# Patient Record
Sex: Male | Born: 1990 | Race: White | Hispanic: No | Marital: Single | Smoking: Current every day smoker
Health system: Southern US, Community
[De-identification: ages and names within clinical notes are randomized; demographics above are authoritative.]

## PROBLEM LIST (undated history)

## (undated) ENCOUNTER — Emergency Department: Admission: EM | Payer: Self-pay | Source: Home / Self Care

## (undated) DIAGNOSIS — T7840XA Allergy, unspecified, initial encounter: Secondary | ICD-10-CM

## (undated) DIAGNOSIS — B9689 Other specified bacterial agents as the cause of diseases classified elsewhere: Secondary | ICD-10-CM

## (undated) DIAGNOSIS — I5189 Other ill-defined heart diseases: Secondary | ICD-10-CM

## (undated) DIAGNOSIS — F1910 Other psychoactive substance abuse, uncomplicated: Secondary | ICD-10-CM

## (undated) DIAGNOSIS — E7800 Pure hypercholesterolemia, unspecified: Secondary | ICD-10-CM

## (undated) HISTORY — DX: Other psychoactive substance abuse, uncomplicated: F19.10

## (undated) HISTORY — DX: Other ill-defined heart diseases: I51.89

## (undated) HISTORY — DX: Allergy, unspecified, initial encounter: T78.40XA

## (undated) HISTORY — DX: Other specified bacterial agents as the cause of diseases classified elsewhere: B96.89

## (undated) HISTORY — DX: Pure hypercholesterolemia, unspecified: E78.00

---
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
## 2013-07-08 ENCOUNTER — Emergency Department: Payer: Self-pay | Admitting: Emergency Medicine

## 2013-07-08 LAB — CBC WITH DIFFERENTIAL/PLATELET
BASOS ABS: 0.1 10*3/uL (ref 0.0–0.1)
Basophil %: 0.6 %
EOS ABS: 0 10*3/uL (ref 0.0–0.7)
Eosinophil %: 0.3 %
HCT: 50.6 % (ref 40.0–52.0)
HGB: 17.1 g/dL (ref 13.0–18.0)
LYMPHS ABS: 3 10*3/uL (ref 1.0–3.6)
Lymphocyte %: 27.4 %
MCH: 30 pg (ref 26.0–34.0)
MCHC: 33.7 g/dL (ref 32.0–36.0)
MCV: 89 fL (ref 80–100)
MONOS PCT: 8.7 %
Monocyte #: 1 x10 3/mm (ref 0.2–1.0)
NEUTROS ABS: 6.9 10*3/uL — AB (ref 1.4–6.5)
Neutrophil %: 63 %
Platelet: 255 10*3/uL (ref 150–440)
RBC: 5.68 10*6/uL (ref 4.40–5.90)
RDW: 14.1 % (ref 11.5–14.5)
WBC: 11 10*3/uL — ABNORMAL HIGH (ref 3.8–10.6)

## 2013-07-08 LAB — COMPREHENSIVE METABOLIC PANEL
ALT: 19 U/L (ref 12–78)
AST: 31 U/L (ref 15–37)
Albumin: 5.5 g/dL — ABNORMAL HIGH (ref 3.4–5.0)
Alkaline Phosphatase: 91 U/L
Anion Gap: 11 (ref 7–16)
BILIRUBIN TOTAL: 0.7 mg/dL (ref 0.2–1.0)
BUN: 18 mg/dL (ref 7–18)
CHLORIDE: 105 mmol/L (ref 98–107)
Calcium, Total: 10.1 mg/dL (ref 8.5–10.1)
Co2: 23 mmol/L (ref 21–32)
Creatinine: 1.15 mg/dL (ref 0.60–1.30)
EGFR (African American): >60
EGFR (Non-African Amer.): >60
GLUCOSE: 110 mg/dL — AB (ref 65–99)
OSMOLALITY: 280 (ref 275–301)
Potassium: 4.1 mmol/L (ref 3.5–5.1)
Sodium: 139 mmol/L (ref 136–145)
Total Protein: 9.1 g/dL — ABNORMAL HIGH (ref 6.4–8.2)

## 2013-07-08 LAB — URINALYSIS, COMPLETE
BILIRUBIN, UR: NEGATIVE
Blood: NEGATIVE
GLUCOSE, UR: NEGATIVE mg/dL (ref 0–75)
KETONE: NEGATIVE
LEUKOCYTE ESTERASE: NEGATIVE
Nitrite: NEGATIVE
Ph: 6 (ref 4.5–8.0)
Protein: 100
Specific Gravity: 1.03 (ref 1.003–1.030)

## 2013-07-14 ENCOUNTER — Emergency Department: Payer: Self-pay | Admitting: Emergency Medicine

## 2013-07-14 LAB — URINALYSIS, COMPLETE
Bacteria: NONE SEEN
Bilirubin,UR: NEGATIVE
Glucose,UR: NEGATIVE mg/dL (ref 0–75)
Ketone: NEGATIVE
Nitrite: NEGATIVE
PH: 6 (ref 4.5–8.0)
RBC,UR: 123 /HPF (ref 0–5)
Specific Gravity: 1.019 (ref 1.003–1.030)
Squamous Epithelial: 1
WBC UR: 6 /HPF (ref 0–5)

## 2013-07-14 LAB — COMPREHENSIVE METABOLIC PANEL
ALK PHOS: 72 U/L
ALT: 19 U/L (ref 12–78)
ANION GAP: 6 — AB (ref 7–16)
Albumin: 4.5 g/dL (ref 3.4–5.0)
BILIRUBIN TOTAL: 0.3 mg/dL (ref 0.2–1.0)
BUN: 13 mg/dL (ref 7–18)
CALCIUM: 9.7 mg/dL (ref 8.5–10.1)
CO2: 25 mmol/L (ref 21–32)
Chloride: 108 mmol/L — ABNORMAL HIGH (ref 98–107)
Creatinine: 1.11 mg/dL (ref 0.60–1.30)
EGFR (African American): >60
GLUCOSE: 118 mg/dL — AB (ref 65–99)
OSMOLALITY: 279 (ref 275–301)
Potassium: 4 mmol/L (ref 3.5–5.1)
SGOT(AST): 30 U/L (ref 15–37)
SODIUM: 139 mmol/L (ref 136–145)
TOTAL PROTEIN: 7.7 g/dL (ref 6.4–8.2)

## 2013-07-14 LAB — CBC
HCT: 44 % (ref 40.0–52.0)
HGB: 14.9 g/dL (ref 13.0–18.0)
MCH: 30.4 pg (ref 26.0–34.0)
MCHC: 33.8 g/dL (ref 32.0–36.0)
MCV: 90 fL (ref 80–100)
Platelet: 196 10*3/uL (ref 150–440)
RBC: 4.9 10*6/uL (ref 4.40–5.90)
RDW: 14 % (ref 11.5–14.5)
WBC: 5.2 10*3/uL (ref 3.8–10.6)

## 2015-02-04 ENCOUNTER — Encounter: Payer: Self-pay | Admitting: Medical Oncology

## 2015-02-04 ENCOUNTER — Emergency Department
Admission: EM | Admit: 2015-02-04 | Discharge: 2015-02-04 | Disposition: A | Discharge status: Home / Self Care | Payer: Self-pay | Attending: Emergency Medicine | Admitting: Emergency Medicine

## 2015-02-04 DIAGNOSIS — Z880 Allergy status to penicillin: Secondary | ICD-10-CM | POA: Insufficient documentation

## 2015-02-04 DIAGNOSIS — K0889 Other specified disorders of teeth and supporting structures: Secondary | ICD-10-CM | POA: Insufficient documentation

## 2015-02-04 DIAGNOSIS — K029 Dental caries, unspecified: Secondary | ICD-10-CM | POA: Insufficient documentation

## 2015-02-04 DIAGNOSIS — F17200 Nicotine dependence, unspecified, uncomplicated: Secondary | ICD-10-CM | POA: Insufficient documentation

## 2015-02-04 DIAGNOSIS — K047 Periapical abscess without sinus: Secondary | ICD-10-CM | POA: Insufficient documentation

## 2015-02-04 MED ORDER — DOXYCYCLINE HYCLATE 100 MG PO TABS: 100.0000 mg | ORAL_TABLET | Freq: Two times a day (BID) | ORAL | Status: DC

## 2015-02-04 MED ORDER — MAGIC MOUTHWASH W/LIDOCAINE
5.0000 mL | Freq: Four times a day (QID) | ORAL | Status: DC
Start: 2015-02-04 — End: 2015-10-11

## 2015-02-04 MED ORDER — LIDOCAINE-EPINEPHRINE 2 %-1:100000 IJ SOLN
1.7000 mL | Freq: Once | INTRAMUSCULAR | Status: DC
  Filled 2015-02-04: qty 1.7

## 2015-02-04 NOTE — Discharge Instructions (Signed)
Dental Abscess °A dental abscess is a collection of pus in or around a tooth. °CAUSES °This condition is caused by a bacterial infection around the root of the tooth that involves the inner part of the tooth (pulp). It may result from: °· Severe tooth decay. °· Trauma to the tooth that allows bacteria to enter into the pulp, such as a broken or chipped tooth. °· Severe gum disease around a tooth. °SYMPTOMS °Symptoms of this condition include: °· Severe pain in and around the infected tooth. °· Swelling and redness around the infected tooth, in the mouth, or in the face. °· Tenderness. °· Pus drainage. °· Bad breath. °· Bitter taste in the mouth. °· Difficulty swallowing. °· Difficulty opening the mouth. °· Nausea. °· Vomiting. °· Chills. °· Swollen neck glands. °· Fever. °DIAGNOSIS °This condition is diagnosed with examination of the infected tooth. During the exam, your dentist may tap on the infected tooth. Your dentist will also ask about your medical and dental history and may order X-rays. °TREATMENT °This condition is treated by eliminating the infection. This may be done with: °· Antibiotic medicine. °· A root canal. This may be performed to save the tooth. °· Pulling (extracting) the tooth. This may also involve draining the abscess. This is done if the tooth cannot be saved. °HOME CARE INSTRUCTIONS °· Take medicines only as directed by your dentist. °· If you were prescribed antibiotic medicine, finish all of it even if you start to feel better. °· Rinse your mouth (gargle) often with salt water to relieve pain or swelling. °· Do not drive or operate heavy machinery while taking pain medicine. °· Do not apply heat to the outside of your mouth. °· Keep all follow-up visits as directed by your dentist. This is important. °SEEK MEDICAL CARE IF: °· Your pain is worse and is not helped by medicine. °SEEK IMMEDIATE MEDICAL CARE IF: °· You have a fever or chills. °· Your symptoms suddenly get worse. °· You have a  very bad headache. °· You have problems breathing or swallowing. °· You have trouble opening your mouth. °· You have swelling in your neck or around your eye. °  °This information is not intended to replace advice given to you by your health care provider. Make sure you discuss any questions you have with your health care provider. °  °Document Released: 01/09/2005 Document Revised: 05/26/2014 Document Reviewed: 01/06/2014 °Elsevier Interactive Patient Education ©2016 [employer] ° °Dental Care and Dentist Visits °Dental care supports good overall health. Regular dental visits can also help you avoid dental pain, bleeding, infection, and other more serious health problems in the future. It is important to keep the mouth healthy because diseases in the teeth, gums, and other oral tissues can spread to other areas of the body. Some problems, such as diabetes, heart disease, and pre-term labor have been associated with poor oral health.  °See your dentist every 6 months. If you experience emergency problems such as a toothache or broken tooth, go to the dentist right away. If you see your dentist regularly, you may catch problems early. It is easier to be treated for problems in the early stages.  °WHAT TO EXPECT AT A DENTIST VISIT  °Your dentist will look for many common oral health problems and recommend proper treatment. At your regular dental visit, you can expect: °· Gentle cleaning of the teeth and gums. This includes scraping and polishing. This helps to remove the sticky substance around the teeth and gums (  plaque). Plaque forms in the mouth shortly after eating. Over time, plaque hardens on the teeth as tartar. If tartar is not removed regularly, it can cause problems. Cleaning also helps remove stains. °· Periodic X-rays. These pictures of the teeth and supporting bone will help your dentist assess the health of your teeth. °· Periodic fluoride treatments. Fluoride is a natural mineral shown to help  strengthen teeth. Fluoride treatment involves applying a fluoride gel or varnish to the teeth. It is most commonly done in children. °· Examination of the mouth, tongue, jaws, teeth, and gums to look for any oral health problems, such as: °¨ Cavities (dental caries). This is decay on the tooth caused by plaque, sugar, and acid in the mouth. It is best to catch a cavity when it is small. °¨ Inflammation of the gums caused by plaque buildup (gingivitis). °¨ Problems with the mouth or malformed or misaligned teeth. °¨ Oral cancer or other diseases of the soft tissues or jaws.  °KEEP YOUR TEETH AND GUMS HEALTHY °For healthy teeth and gums, follow these general guidelines as well as your dentist's specific advice: °· Have your teeth professionally cleaned at the dentist every 6 months. °· Brush twice daily with a fluoride toothpaste. °· Floss your teeth daily.  °· Ask your dentist if you need fluoride supplements, treatments, or fluoride toothpaste. °· Eat a healthy diet. Reduce foods and drinks with added sugar. °· Avoid smoking. °TREATMENT FOR ORAL HEALTH PROBLEMS °If you have oral health problems, treatment varies depending on the conditions present in your teeth and gums. °· Your caregiver will most likely recommend good oral hygiene at each visit. °· For cavities, gingivitis, or other oral health disease, your caregiver will perform a procedure to treat the problem. This is typically done at a separate appointment. Sometimes your caregiver will refer you to another dental specialist for specific tooth problems or for surgery. °SEEK IMMEDIATE DENTAL CARE IF: °· You have pain, bleeding, or soreness in the gum, tooth, jaw, or mouth area. °· A permanent tooth becomes loose or separated from the gum socket. °· You experience a blow or injury to the mouth or jaw area. °  °This information is not intended to replace advice given to you by your health care provider. Make sure you discuss any questions you have with your  health care provider. °  °Document Released: 09/21/2010 Document Revised: 04/03/2011 Document Reviewed: 09/21/2010 °Elsevier Interactive Patient Education ©2016 [employer] ° °

## 2015-02-04 NOTE — ED Notes (Signed)
Pt reports left sided jaw pain possibly related to tooth ache that began Monday.

## 2015-02-04 NOTE — ED Notes (Signed)
C/o pain left jaw past few days

## 2015-02-04 NOTE — ED Provider Notes (Signed)
[facility] Emergency Department Provider Note  ____________________________________________  Time seen: Approximately 8:10 AM  I have reviewed the triage vital signs and the nursing notes.   HISTORY  Chief Complaint Jaw Pain    HPI Jesse Bullock is a 25 y.o. male who presents with left-sided lower jaw pain. Patient states that he has a "infected tooth." Symptoms began 3 days prior. Patient has not seen a dentist for same. He states that he does not have a dentist. States that the pain is radiating from his jaw into his neck and maxillary area. He denies any fevers but does complain of some sweating at night. He denies any shortness of breath, difficulty swallowing, chest pain, abdominal pain, nausea or vomiting.   History reviewed. No pertinent past medical history.  There are no active problems to display for this patient.   History reviewed. No pertinent past surgical history.  Current Outpatient Rx  Name  Route  Sig  Dispense  Refill  . doxycycline (VIBRA-TABS) 100 MG tablet   Oral   Take 1 tablet (100 mg total) by mouth 2 (two) times daily.   14 tablet   0   . magic mouthwash w/lidocaine SOLN   Oral   Take 5 mLs by mouth 4 (four) times daily.   240 mL   0     Dispense in a 1/1/1/1 ratio. Use lidocaine, diphen ...     Allergies Aspirin; Ibuprofen; and Penicillins  No family history on file.  Social History Social History  Substance Use Topics  . Smoking status: Current Every Day Smoker  . Smokeless tobacco: None  . Alcohol Use: Yes     Comment: occ     Review of Systems  Constitutional: No fever/chills Eyes: No visual changes. No discharge ENT: No sore throat. Endorses left-sided lower dental pain. Cardiovascular: no chest pain. Respiratory: no cough. No SOB. Gastrointestinal: No abdominal pain.  No nausea, no vomiting.  No diarrhea.  No constipation. Genitourinary: Negative for dysuria. No hematuria Musculoskeletal:  Negative for back pain. Skin: Negative for rash. Neurological: Negative for headaches, focal weakness or numbness. 10-point ROS otherwise negative.  ____________________________________________   PHYSICAL EXAM:  VITAL SIGNS: ED Triage Vitals  Enc Vitals Group     BP 02/04/15 0740 110/77 mmHg     Pulse Rate 02/04/15 0740 98     Resp 02/04/15 0740 18     Temp 02/04/15 0740 97.8 F (36.6 C)     Temp Source 02/04/15 0740 Oral     SpO2 02/04/15 0740 97 %     Weight 02/04/15 0740 105 lb (47.628 kg)     Height 02/04/15 0740 5\' 7"  (1.702 m)     Head Cir --      Peak Flow --      Pain Score 02/04/15 0741 10     Pain Loc --      Pain Edu? --      Excl. in GC? --      Constitutional: Alert and oriented. Well appearing and in no acute distress. Eyes: Conjunctivae are normal. PERRL. EOMI. Head: Atraumatic. ENT:      Ears:      Nose: No congestion/rhinnorhea.      Mouth/Throat: Mucous membranes are moist. Poor dentition throughout. Multiple caries. Erythema and edema surrounding second molar left lower dentition. No fluctuance to palpation with tongue depressor. No drainage noted. Neck: No stridor.   Hematological/Lymphatic/Immunilogical: No cervical lymphadenopathy. Cardiovascular: Normal rate, regular rhythm. Normal S1 and  S2.  Good peripheral circulation. Respiratory: Normal respiratory effort without tachypnea or retractions. Lungs CTAB. Gastrointestinal: Soft and nontender. No distention. No CVA tenderness. Musculoskeletal: No lower extremity tenderness nor edema.  No joint effusions. Neurologic:  Normal speech and language. No gross focal neurologic deficits are appreciated.  Skin:  Skin is warm, dry and intact. No rash noted. Psychiatric: Mood and affect are normal. Speech and behavior are normal. Patient exhibits appropriate insight and judgement.   ____________________________________________   LABS (all labs ordered are listed, but only abnormal results are  displayed)  Labs Reviewed - No data to display ____________________________________________  EKG   ____________________________________________  RADIOLOGY   No results found.  ____________________________________________    PROCEDURES  Procedure(s) performed:       Medications  lidocaine-EPINEPHrine (XYLOCAINE W/EPI) 2 %-1:100000 (with pres) injection 1.7 mL (not administered)     ____________________________________________   INITIAL IMPRESSION / ASSESSMENT AND PLAN / ED COURSE  Pertinent labs & imaging results that were available during my care of the patient were reviewed by me and considered in my medical decision making (see chart for details).  Patient's diagnosis is consistent with dental abscess to left lower jaw. Patient will be discharged home with prescriptions for antibiotics, and magic mouthwash for symptom control. Patient is to follow up with [facility] dental clinic if symptoms persist past this treatment coarse. Patient is given ED precautions to return to the ED for any worsening or new symptoms.     ____________________________________________  FINAL CLINICAL IMPRESSION(S) / ED DIAGNOSES  Final diagnoses:  Dental abscess      NEW MEDICATIONS STARTED DURING THIS VISIT:  New Prescriptions   DOXYCYCLINE (VIBRA-TABS) 100 MG TABLET    Take 1 tablet (100 mg total) by mouth 2 (two) times daily.   MAGIC MOUTHWASH W/LIDOCAINE SOLN    Take 5 mLs by mouth 4 (four) times daily.        Delorise Royals Cuthriell, PA-C 02/04/15 [id number]  Sharman Cheek, MD 02/04/15 1537

## 2015-10-11 ENCOUNTER — Encounter: Payer: Self-pay | Admitting: Medical Oncology

## 2015-10-11 ENCOUNTER — Emergency Department
Admission: EM | Admit: 2015-10-11 | Discharge: 2015-10-11 | Disposition: A | Discharge status: Home / Self Care | Payer: Self-pay | Attending: Emergency Medicine | Admitting: Emergency Medicine

## 2015-10-11 DIAGNOSIS — F17200 Nicotine dependence, unspecified, uncomplicated: Secondary | ICD-10-CM | POA: Insufficient documentation

## 2015-10-11 DIAGNOSIS — J029 Acute pharyngitis, unspecified: Secondary | ICD-10-CM | POA: Insufficient documentation

## 2015-10-11 MED ORDER — LIDOCAINE VISCOUS 2 % MT SOLN: 20.0000 mL | OROMUCOSAL | 0 refills | Status: DC | PRN

## 2015-10-11 MED ORDER — AZITHROMYCIN 250 MG PO TABS: ORAL_TABLET | ORAL | 0 refills | Status: DC

## 2015-10-11 NOTE — ED Notes (Signed)
Sore throat for 1 week denies any fever  States increased pain with swallowing

## 2015-10-11 NOTE — ED Provider Notes (Signed)
[facility] Emergency Department Provider Note  ____________________________________________  Time seen: Approximately 11:52 AM  I have reviewed the triage vital signs and the nursing notes.   HISTORY  Chief Complaint Sore Throat    HPI Jesse Bullock is a 25 y.o. male presents for evaluation of sore throat times one week. States recently increased pain when swallowing. States symptoms started yesterday progressively get worse today. Denies any fever chills.   History reviewed. No pertinent past medical history.  There are no active problems to display for this patient.   History reviewed. No pertinent surgical history.  Prior to Admission medications   Medication Sig Start Date End Date Taking? Authorizing Provider  doxycycline (VIBRA-TABS) 100 MG tablet Take 1 tablet (100 mg total) by mouth 2 (two) times daily. 02/04/15   Delorise RoyalsJonathan D Cuthriell, PA-C  magic mouthwash w/lidocaine SOLN Take 5 mLs by mouth 4 (four) times daily. 02/04/15   Delorise RoyalsJonathan D Cuthriell, PA-C    Allergies Aspirin; Ibuprofen; and Penicillins  No family history on file.  Social History Social History  Substance Use Topics  . Smoking status: Current Every Day Smoker  . Smokeless tobacco: Not on file  . Alcohol use Yes     Comment: occ    Review of Systems Constitutional: No fever/chills Eyes: No visual changes. ENT: Positive sore throat. Cardiovascular: Denies chest pain. Respiratory: Denies shortness of breath. Musculoskeletal: Negative for back pain. Skin: Negative for rash. Neurological: Negative for headaches, focal weakness or numbness.  10-point ROS otherwise negative.  ____________________________________________   PHYSICAL EXAM:  VITAL SIGNS: ED Triage Vitals  Enc Vitals Group     BP 10/11/15 1128 (!) 143/99     Pulse Rate 10/11/15 1128 (!) 103     Resp 10/11/15 1128 18     Temp 10/11/15 1128 98.2 F (36.8 C)     Temp Source 10/11/15 1128 Oral      SpO2 10/11/15 1128 96 %     Weight 10/11/15 1127 110 lb (49.9 kg)     Height 10/11/15 1127 5\' 8"  (1.727 m)     Head Circumference --      Peak Flow --      Pain Score 10/11/15 1127 7     Pain Loc --      Pain Edu? --      Excl. in GC? --     Constitutional: Alert and oriented. Well appearing and in no acute distress. Eyes: Conjunctivae are normal. PERRL. EOMI. Head: Atraumatic. Nose: No congestion/rhinnorhea. Mouth/Throat: Mucous membranes are moist.  Oropharynx Extremely erythematous without exudate. Neck: No stridor. Positive adenopathy noted.   Cardiovascular: Normal rate, regular rhythm. Grossly normal heart sounds.  Good peripheral circulation. Respiratory: Normal respiratory effort.  No retractions. Lungs CTAB. Musculoskeletal: No lower extremity tenderness nor edema.  No joint effusions. Neurologic:  Normal speech and language. No gross focal neurologic deficits are appreciated. No gait instability. Skin:  Skin is warm, dry and intact. No rash noted. Psychiatric: Mood and affect are normal. Speech and behavior are normal.  ____________________________________________   LABS (all labs ordered are listed, but only abnormal results are displayed)  Labs Reviewed - No data to display ____________________________________________  EKG   ____________________________________________  RADIOLOGY   ____________________________________________   PROCEDURES  Procedure(s) performed: None  Critical Care performed: No  ____________________________________________   INITIAL IMPRESSION / ASSESSMENT AND PLAN / ED COURSE  Pertinent labs & imaging results that were available during my care of the patient were reviewed by  me and considered in my medical decision making (see chart for details). Review of the [place] CSRS was performed in accordance of the [facility] prior to dispensing any controlled drugs.  Acute pharyngitis. Rx given for amoxicillin 500 mg 3 times a day,  discussed lidocaine. Work excuse 24 hours given. Patient follow-up PCP or return to ER with any worsening symptomology.  Clinical Course    ____________________________________________   FINAL CLINICAL IMPRESSION(S) / ED DIAGNOSES  Final diagnoses:  None     This chart was dictated using voice recognition software/Dragon. Despite best efforts to proofread, errors can occur which can change the meaning. Any change was purely unintentional.    Evangeline Dakin, PA-C 10/11/15 1610    Minna Antis, MD 10/11/15 [phone]

## 2015-10-11 NOTE — ED Triage Notes (Signed)
Sore throat x 1 week

## 2015-10-19 ENCOUNTER — Encounter: Payer: Self-pay | Admitting: Emergency Medicine

## 2015-10-19 ENCOUNTER — Emergency Department
Admission: EM | Admit: 2015-10-19 | Discharge: 2015-10-19 | Disposition: A | Discharge status: Home / Self Care | Payer: Self-pay | Attending: Emergency Medicine | Admitting: Emergency Medicine

## 2015-10-19 DIAGNOSIS — B349 Viral infection, unspecified: Secondary | ICD-10-CM | POA: Insufficient documentation

## 2015-10-19 DIAGNOSIS — F17200 Nicotine dependence, unspecified, uncomplicated: Secondary | ICD-10-CM | POA: Insufficient documentation

## 2015-10-19 LAB — CBC
HCT: 45.6 % (ref 40.0–52.0)
HEMOGLOBIN: 16.2 g/dL (ref 13.0–18.0)
MCH: 31.5 pg (ref 26.0–34.0)
MCHC: 35.5 g/dL (ref 32.0–36.0)
MCV: 88.9 fL (ref 80.0–100.0)
PLATELETS: 205 10*3/uL (ref 150–440)
RBC: 5.13 MIL/uL (ref 4.40–5.90)
RDW: 15 % — ABNORMAL HIGH (ref 11.5–14.5)
WBC: 8.1 10*3/uL (ref 3.8–10.6)

## 2015-10-19 LAB — COMPREHENSIVE METABOLIC PANEL
ALBUMIN: 4.7 g/dL (ref 3.5–5.0)
ALT: 16 U/L — AB (ref 17–63)
AST: 20 U/L (ref 15–41)
Alkaline Phosphatase: 75 U/L (ref 38–126)
Anion gap: 10 (ref 5–15)
BUN: 7 mg/dL (ref 6–20)
CHLORIDE: 106 mmol/L (ref 101–111)
CO2: 25 mmol/L (ref 22–32)
CREATININE: 0.85 mg/dL (ref 0.61–1.24)
Calcium: 9.6 mg/dL (ref 8.9–10.3)
GFR calc non Af Amer: >60 mL/min (ref >60)
GLUCOSE: 89 mg/dL (ref 65–99)
Potassium: 4.3 mmol/L (ref 3.5–5.1)
SODIUM: 141 mmol/L (ref 135–145)
Total Bilirubin: 0.5 mg/dL (ref 0.3–1.2)
Total Protein: 7.7 g/dL (ref 6.5–8.1)

## 2015-10-19 LAB — URINALYSIS COMPLETE WITH MICROSCOPIC (ARMC ONLY)
Bacteria, UA: NONE SEEN
Bilirubin Urine: NEGATIVE
GLUCOSE, UA: NEGATIVE mg/dL
Hgb urine dipstick: NEGATIVE
Ketones, ur: NEGATIVE mg/dL
Leukocytes, UA: NEGATIVE
Nitrite: NEGATIVE
PROTEIN: NEGATIVE mg/dL
RBC / HPF: NONE SEEN RBC/hpf (ref 0–5)
SQUAMOUS EPITHELIAL / LPF: NONE SEEN
Specific Gravity, Urine: 1.002 — ABNORMAL LOW (ref 1.005–1.030)
pH: 8 (ref 5.0–8.0)

## 2015-10-19 LAB — LIPASE, BLOOD: LIPASE: 50 U/L (ref 11–51)

## 2015-10-19 NOTE — ED Triage Notes (Signed)
Was seen last  About 1 1.2 weeks ago with sore throat  Now having body aches with fever/chills and n/v//d  Last time vomited was this am

## 2015-10-19 NOTE — ED Notes (Signed)
Pt. States being seen here last week for flu-like sx, home tx. With zpack, states completed, and tylenol. Fever persists and vomiting continues.

## 2015-10-19 NOTE — ED Provider Notes (Signed)
[facility] Emergency Department Provider Note   ____________________________________________   First MD Initiated Contact with Patient 10/19/15 1659     (approximate)  I have reviewed the triage vital signs and the nursing notes.   HISTORY  Chief Complaint No chief complaint on file.   HPI Jesse Bullock is a 25 y.o. male without a history of chronic medical conditions who is presenting to the emergency department today complaining of 3 days of nausea vomiting diarrhea as well as cough and runny nose. He is also complaining of a intermittent frontal headache which she said was severe yesterday but has decreased only a mild headache today. All sclerae of bilateral ear pressure. Says that he was exposed to his son who is 24 years old in preschool and was sick with a similar illness. He says that he was prescribed a course of azithromycin last week for sore throat and his symptoms had resolved but then he began having these new symptoms just 3 days ago. Says that he has felt febrile overnight and isn't vomiting overnight but has not taken any ibuprofen, Tylenol or aspirin. Has not documented any fever with thermometer at home. Has also been taking Zofran at home which he says is been relieving his symptoms during the day but then he will wake up once the medicine wears off at night. He says that he has been tolerating fluids. Says that he has only been eating minimally.Also states nonbloody stool 4-5 times per day.   History reviewed. No pertinent past medical history.  There are no active problems to display for this patient.   History reviewed. No pertinent surgical history.  Prior to Admission medications   Medication Sig Start Date End Date Taking? Authorizing Provider  azithromycin (ZITHROMAX Z-PAK) 250 MG tablet Take 2 tablets (500 mg) on  Day 1,  followed by 1 tablet (250 mg) once daily on Days 2 through 5. 10/11/15   Charmayne Sheerharles M Beers, PA-C  lidocaine  (XYLOCAINE) 2 % solution Use as directed 20 mLs in the mouth or throat as needed for mouth pain. 10/11/15   Evangeline Dakinharles M Beers, PA-C    Allergies Aspirin; Ibuprofen; and Penicillins  No family history on file.  Social History Social History  Substance Use Topics  . Smoking status: Current Every Day Smoker  . Smokeless tobacco: Never Used  . Alcohol use Yes     Comment: occ    Review of Systems Constitutional: As above Eyes: No visual changes. ENT: No sore throat. Cardiovascular: Denies chest pain. Respiratory: Cough  Gastrointestinal:No constipation. Genitourinary: Negative for dysuria. Musculoskeletal: Negative for back pain. Skin: Negative for rash. Neurological: Negative for focal weakness or numbness.  10-point ROS otherwise negative.  ____________________________________________   PHYSICAL EXAM:  VITAL SIGNS: ED Triage Vitals  Enc Vitals Group     BP 10/19/15 1608 101/75     Pulse Rate 10/19/15 1608 84     Resp 10/19/15 1608 18     Temp 10/19/15 1608 98.1 F (36.7 C)     Temp Source 10/19/15 1608 Oral     SpO2 10/19/15 1608 97 %     Weight 10/19/15 1607 110 lb (49.9 kg)     Height 10/19/15 1607 5\' 8"  (1.727 m)     Head Circumference --      Peak Flow --      Pain Score 10/19/15 1606 4     Pain Loc --      Pain Edu? --  Excl. in GC? --     Constitutional: Alert and oriented. Well appearing and in no acute distress. Eyes: Conjunctivae are normal. PERRL. EOMI. Head: Atraumatic.Normal tympanic membranes bilaterally. No tenderness to palpation over the sinuses. Nose: No congestion/rhinnorhea. Mouth/Throat: Mucous membranes are moist.  Oropharynx non-erythematous. Neck: No stridor.   Cardiovascular: Normal rate, regular rhythm. Grossly normal heart sounds.  Good peripheral circulation. Respiratory: Normal respiratory effort.  No retractions. Lungs CTAB. Gastrointestinal: Soft with mild diffuse tenderness palpation. No distention. No CVA  tenderness. Musculoskeletal: No lower extremity tenderness nor edema.  No joint effusions. Neurologic:  Normal speech and language. No gross focal neurologic deficits are appreciated. No gait instability. Skin:  Skin is warm, dry and intact. No rash noted. Psychiatric: Mood and affect are normal. Speech and behavior are normal.  ____________________________________________   LABS (all labs ordered are listed, but only abnormal results are displayed)  Labs Reviewed  COMPREHENSIVE METABOLIC PANEL - Abnormal; Notable for the following:       Result Value   ALT 16 (*)    All other components within normal limits  CBC - Abnormal; Notable for the following:    RDW 15.0 (*)    All other components within normal limits  URINALYSIS COMPLETEWITH MICROSCOPIC ([facility] ONLY) - Abnormal; Notable for the following:    Color, Urine STRAW (*)    APPearance CLEAR (*)    Specific Gravity, Urine 1.002 (*)    All other components within normal limits  LIPASE, BLOOD   ____________________________________________  EKG   ____________________________________________  RADIOLOGY   ____________________________________________   PROCEDURES  Procedure(s) performed:   Procedures  Critical Care performed:   ____________________________________________   INITIAL IMPRESSION / ASSESSMENT AND PLAN / ED COURSE  Pertinent labs & imaging results that were available during my care of the patient were reviewed by me and considered in my medical decision making (see chart for details).  Patient with likely viral syndrome that he caught from his son. Unlikely to be acute abdomen. Very reassuring lab work. Likely pain from cramping from nausea vomiting and diarrhea. I discussed with the patient continuing his Zofran as well as drinking plenty of fluids and eating as tolerated. We also discussed the course of his illness back in last approximately one week. He understands to return for any worsening or  concerning symptoms. Will be discharged home. He is understanding of the plan and willing to comply.  Clinical Course     ____________________________________________   FINAL CLINICAL IMPRESSION(S) / ED DIAGNOSES  Nausea vomiting and diarrhea. Viral syndrome.    NEW MEDICATIONS STARTED DURING THIS VISIT:  New Prescriptions   No medications on file     Note:  This document was prepared using Dragon voice recognition software and may include unintentional dictation errors.    Myrna Blazer, MD 10/19/15 [phone]

## 2016-01-05 ENCOUNTER — Emergency Department
Admission: EM | Admit: 2016-01-05 | Discharge: 2016-01-05 | Disposition: A | Discharge status: Home / Self Care | Payer: Self-pay | Attending: Emergency Medicine | Admitting: Emergency Medicine

## 2016-01-05 ENCOUNTER — Encounter: Payer: Self-pay | Admitting: Emergency Medicine

## 2016-01-05 DIAGNOSIS — F17200 Nicotine dependence, unspecified, uncomplicated: Secondary | ICD-10-CM | POA: Insufficient documentation

## 2016-01-05 DIAGNOSIS — R1110 Vomiting, unspecified: Secondary | ICD-10-CM | POA: Insufficient documentation

## 2016-01-05 DIAGNOSIS — Z5321 Procedure and treatment not carried out due to patient leaving prior to being seen by health care provider: Secondary | ICD-10-CM | POA: Insufficient documentation

## 2016-01-05 LAB — COMPREHENSIVE METABOLIC PANEL
ALK PHOS: 69 U/L (ref 38–126)
ALT: 14 U/L — ABNORMAL LOW (ref 17–63)
ANION GAP: 7 (ref 5–15)
AST: 23 U/L (ref 15–41)
Albumin: 4.8 g/dL (ref 3.5–5.0)
BUN: 14 mg/dL (ref 6–20)
CALCIUM: 9.5 mg/dL (ref 8.9–10.3)
CO2: 27 mmol/L (ref 22–32)
Chloride: 102 mmol/L (ref 101–111)
Creatinine, Ser: 1.17 mg/dL (ref 0.61–1.24)
GFR calc non Af Amer: >60 mL/min (ref >60)
Glucose, Bld: 126 mg/dL — ABNORMAL HIGH (ref 65–99)
Potassium: 4.1 mmol/L (ref 3.5–5.1)
SODIUM: 136 mmol/L (ref 135–145)
Total Bilirubin: 1.1 mg/dL (ref 0.3–1.2)
Total Protein: 7.9 g/dL (ref 6.5–8.1)

## 2016-01-05 LAB — CBC
HCT: 48.8 % (ref 40.0–52.0)
HEMOGLOBIN: 16.8 g/dL (ref 13.0–18.0)
MCH: 30.9 pg (ref 26.0–34.0)
MCHC: 34.5 g/dL (ref 32.0–36.0)
MCV: 89.3 fL (ref 80.0–100.0)
Platelets: 146 10*3/uL — ABNORMAL LOW (ref 150–440)
RBC: 5.46 MIL/uL (ref 4.40–5.90)
RDW: 14 % (ref 11.5–14.5)
WBC: 6.8 10*3/uL (ref 3.8–10.6)

## 2016-01-05 LAB — URINALYSIS, COMPLETE (UACMP) WITH MICROSCOPIC
BILIRUBIN URINE: NEGATIVE
Bacteria, UA: NONE SEEN
GLUCOSE, UA: NEGATIVE mg/dL
HGB URINE DIPSTICK: NEGATIVE
KETONES UR: NEGATIVE mg/dL
Leukocytes, UA: NEGATIVE
NITRITE: NEGATIVE
PROTEIN: 30 mg/dL — AB
Specific Gravity, Urine: 1.03 (ref 1.005–1.030)
pH: 5 (ref 5.0–8.0)

## 2016-01-05 LAB — LIPASE, BLOOD: LIPASE: 37 U/L (ref 11–51)

## 2016-01-05 MED ORDER — ONDANSETRON 4 MG PO TBDP
4.0000 mg | ORAL_TABLET | Freq: Once | ORAL | Status: AC | PRN
  Administered 2016-01-05: 4 mg via ORAL
  Filled 2016-01-05: qty 1

## 2016-01-05 NOTE — ED Triage Notes (Signed)
Pt ambulatory to triage with slow and steady gait, with c/o vomiting x 2 days. Pt denies diarrhea. Reports fever today of tmax 100.3, children's tylenol at 9 pm. Pt alert and oriented x 4, skin warm and dry, respirations even and unlabored.

## 2016-04-06 ENCOUNTER — Ambulatory Visit: Payer: Self-pay | Admitting: Urology

## 2016-04-06 VITALS — BP 131/72 | HR 84 | Temp 97.9°F | Wt 116.4 lb

## 2016-04-06 DIAGNOSIS — G44209 Tension-type headache, unspecified, not intractable: Secondary | ICD-10-CM

## 2016-04-06 DIAGNOSIS — I201 Angina pectoris with documented spasm: Secondary | ICD-10-CM

## 2016-04-06 MED ORDER — DILTIAZEM HCL ER COATED BEADS 240 MG PO CP24: 240.0000 mg | ORAL_CAPSULE | Freq: Every day | ORAL | 0 refills | Status: AC

## 2016-04-06 NOTE — Progress Notes (Signed)
  Patient: Jesse Bullock Male    DOB: 24-Dec-1990   25 y.o.   MRN: [id number] Visit Date: 04/06/2016  Today's Provider: [facility]   Chief Complaint  Patient presents with  . Heart Problem  . Headache   Subjective:    HPI Patient is 26 yo WM who presents today complaining that it feels like one of his heart valve isn't closing.  This is been occurring for over one year.  This incidence occurs intermittently and lasts for several hours at a time.    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  He is 30 days without drug use.    He does not have associated SOB, left arm pain or light headedness.  He does not know of anything that precipitates the pain.  The pain will not come on during strenuous exercise.    Headaches start at the temples.  Associated with blurred vision.      Allergies  Allergen Reactions  . Aspirin   . Haldol [Haloperidol Lactate]   . Ibuprofen   . Penicillins    Previous Medications   AZITHROMYCIN (ZITHROMAX Z-PAK) 250 MG TABLET    Take 2 tablets (500 mg) on  Day 1,  followed by 1 tablet (250 mg) once daily on Days 2 through 5.   LIDOCAINE (XYLOCAINE) 2 % SOLUTION    Use as directed 20 mLs in the mouth or throat as needed for mouth pain.    Review of Systems  Social History  Substance Use Topics  . Smoking status: Current Every Day Smoker    Packs/day: 0.50  . Smokeless tobacco: Never Used  . Alcohol use Yes     Comment: occ   Objective:   BP 131/72   Pulse 84   Temp 97.9 F (36.6 C)   Wt 116 lb 6.4 oz (52.8 kg)   BMI 18.23 kg/m   Physical Exam Constitutional: Well nourished. Alert and oriented, No acute distress. HEENT: [place] AT, moist mucus membranes. Trachea midline, no masses. Cardiovascular: No clubbing, cyanosis, or edema, RRR, no murmurs Respiratory: Normal respiratory effort, no increased work of breathing. GI: Abdomen is soft, non tender, non distended, no abdominal masses. Liver and spleen not palpable.  No  hernias appreciated.  Stool sample for occult testing is not indicated.   GU: No CVA tenderness.  No bladder fullness or masses.   Skin: No rashes, bruises or suspicious lesions. Lymph: No cervical or inguinal adenopathy. Neurologic: Grossly intact, no focal deficits, moving all 4 extremities. Psychiatric: Normal mood and affect.      Assessment & Plan:        1. Prinzmetal angina  - start diltiazem 240 mg daily  2. Tension headaches  - BP med may help  - reassess in one month    [facility]   [facility] [place]

## 2016-04-08 ENCOUNTER — Emergency Department
Admission: EM | Admit: 2016-04-08 | Discharge: 2016-04-08 | Disposition: A | Discharge status: Home / Self Care | Payer: Self-pay | Attending: Emergency Medicine | Admitting: Emergency Medicine

## 2016-04-08 ENCOUNTER — Emergency Department: Payer: Self-pay

## 2016-04-08 ENCOUNTER — Encounter: Payer: Self-pay | Admitting: *Deleted

## 2016-04-08 DIAGNOSIS — F17200 Nicotine dependence, unspecified, uncomplicated: Secondary | ICD-10-CM | POA: Insufficient documentation

## 2016-04-08 DIAGNOSIS — R0789 Other chest pain: Secondary | ICD-10-CM | POA: Insufficient documentation

## 2016-04-08 LAB — BASIC METABOLIC PANEL
ANION GAP: 6 (ref 5–15)
BUN: 17 mg/dL (ref 6–20)
CHLORIDE: 107 mmol/L (ref 101–111)
CO2: 28 mmol/L (ref 22–32)
Calcium: 9 mg/dL (ref 8.9–10.3)
Creatinine, Ser: 0.96 mg/dL (ref 0.61–1.24)
GFR calc Af Amer: >60 mL/min (ref >60)
GFR calc non Af Amer: >60 mL/min (ref >60)
GLUCOSE: 110 mg/dL — AB (ref 65–99)
POTASSIUM: 4.2 mmol/L (ref 3.5–5.1)
Sodium: 141 mmol/L (ref 135–145)

## 2016-04-08 LAB — CBC
HEMATOCRIT: 40.5 % (ref 40.0–52.0)
Hemoglobin: 14 g/dL (ref 13.0–18.0)
MCH: 30.9 pg (ref 26.0–34.0)
MCHC: 34.6 g/dL (ref 32.0–36.0)
MCV: 89.4 fL (ref 80.0–100.0)
Platelets: 163 10*3/uL (ref 150–440)
RBC: 4.53 MIL/uL (ref 4.40–5.90)
RDW: 15.2 % — ABNORMAL HIGH (ref 11.5–14.5)
WBC: 6.2 10*3/uL (ref 3.8–10.6)

## 2016-04-08 LAB — TROPONIN I: Troponin I: <0.03 ng/mL (ref <0.03)

## 2016-04-08 LAB — RAPID HIV SCREEN (HIV 1/2 AB+AG)
HIV 1/2 ANTIBODIES: NONREACTIVE
HIV-1 P24 Antigen - HIV24: NONREACTIVE

## 2016-04-08 MED ORDER — IBUPROFEN 600 MG PO TABS
600.0000 mg | ORAL_TABLET | Freq: Once | ORAL | Status: DC
  Filled 2016-04-08: qty 1

## 2016-04-08 MED ORDER — ACETAMINOPHEN 500 MG PO TABS
1000.0000 mg | ORAL_TABLET | Freq: Once | ORAL | Status: AC
  Administered 2016-04-08: 1000 mg via ORAL
  Filled 2016-04-08: qty 2

## 2016-04-08 NOTE — ED Provider Notes (Signed)
[facility] Emergency Department Provider Note  ____________________________________________   First MD Initiated Contact with Patient 04/08/16 0144     (approximate)  I have reviewed the triage vital signs and the nursing notes.   HISTORY  Chief Complaint Chest Pain    HPI Jesse Bullock is a 26 y.o. male who comes to the emergency department with several weeks of intermittent chest pain. He says the pain is sharp stabbing left sided. It is nonexertional. He has no history of deep vein thrombus or pulmonary embolism. 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 In 2016 he had a course of IV antibiotics for endocarditis but it did not require surgery. He says now he feels like one of his heart valves might be stuck. He says primary care physician earlier this week who felt this was Prinzmetal's angina and prescribed him diltiazem which she has not yet begun to take.   Past Medical History:  Diagnosis Date  . Allergy   . Bacterial heart infection   . High cholesterol   . Substance abuse    prior, but has now detoxed    There are no active problems to display for this patient.   History reviewed. No pertinent surgical history.  Prior to Admission medications   Medication Sig Start Date End Date Taking? Authorizing Provider  azithromycin (ZITHROMAX Z-PAK) 250 MG tablet Take 2 tablets (500 mg) on  Day 1,  followed by 1 tablet (250 mg) once daily on Days 2 through 5. Patient not taking: Reported on 04/06/2016 10/11/15   Charmayne Sheerharles M Beers, PA-C  diltiazem (DILTIAZEM CD) 240 MG 24 hr capsule Take 1 capsule (240 mg total) by mouth daily. 04/06/16   Carollee HerterShannon A McGowan, PA-C  lidocaine (XYLOCAINE) 2 % solution Use as directed 20 mLs in the mouth or throat as needed for mouth pain. Patient not taking: Reported on 04/06/2016 10/11/15   Evangeline Dakinharles M Beers, PA-C    Allergies Ibuprofen; Aspirin; Haldol [haloperidol lactate]; and  Penicillins  Family History  Problem Relation Age of Onset  . Heart disease Father   . Heart disease Paternal Grandmother     Social History Social History  Substance Use Topics  . Smoking status: Current Every Day Smoker    Packs/day: 0.50  . Smokeless tobacco: Never Used  . Alcohol use No     Comment: 30+ days    Review of Systems Constitutional: No fever/chills Eyes: No visual changes. ENT: No sore throat. Cardiovascular: Positive chest pain. Respiratory: Denies shortness of breath. Gastrointestinal: No abdominal pain.  No nausea, no vomiting.  No diarrhea.  No constipation. Genitourinary: Negative for dysuria. Musculoskeletal: Negative for back pain. Skin: Negative for rash. Neurological: Negative for headaches, focal weakness or numbness.  10-point ROS otherwise negative.  ____________________________________________   PHYSICAL EXAM:  VITAL SIGNS: ED Triage Vitals  Enc Vitals Group     BP 04/08/16 0053 121/76     Pulse Rate 04/08/16 0053 88     Resp 04/08/16 0053 18     Temp 04/08/16 0053 98.1 F (36.7 C)     Temp Source 04/08/16 0053 Oral     SpO2 04/08/16 0053 98 %     Weight 04/08/16 0054 115 lb (52.2 kg)     Height 04/08/16 0054 5\' 8"  (1.727 m)     Head Circumference --      Peak Flow --      Pain Score 04/08/16 0054 6  Pain Loc --      Pain Edu? --      Excl. in GC? --     Constitutional: Alert and oriented x 4 well appearing nontoxic no diaphoresis speaks in full, clear sentences Eyes: PERRL EOMI. Head: Atraumatic. Nose: No congestion/rhinnorhea. Mouth/Throat: No trismus Neck: No stridor.   Cardiovascular: Normal rate, regular rhythm. Grossly normal heart sounds.  Good peripheral circulation. Respiratory: Normal respiratory effort.  No retractions. Lungs CTAB and moving good air Gastrointestinal: Soft nondistended nontender no rebound no guarding no peritonitis no McBurney's tenderness negative Rovsing's no costovertebral tenderness  negative Murphy's Musculoskeletal: No lower extremity edema   Neurologic:  Normal speech and language. No gross focal neurologic deficits are appreciated. Skin:  Skin is warm, dry and intact. No rash noted. Psychiatric: Mood and affect are normal. Speech and behavior are normal.    ____________________________________________   DIFFERENTIAL  Pericarditis, myocarditis, acute coronary syndrome, Prinzmetal's angina, pulmonary embolism, aortic dissection, endocarditis ____________________________________________   LABS (all labs ordered are listed, but only abnormal results are displayed)  Labs Reviewed  BASIC METABOLIC PANEL - Abnormal; Notable for the following:       Result Value   Glucose, Bld 110 (*)    All other components within normal limits  CBC - Abnormal; Notable for the following:    RDW 15.2 (*)    All other components within normal limits  TROPONIN I  RAPID HIV SCREEN (HIV 1/2 AB+AG)  HIV ANTIBODY (ROUTINE TESTING)    No signs of acute ischemia __________________________________________  EKG  ED ECG REPORT I, Merrily Brittle, the attending physician, personally viewed and interpreted this ECG.  Date: 04/08/2016 Rate: 78 Rhythm: normal sinus rhythm QRS Axis: normal Intervals: normal ST/T Wave abnormalities: normal Conduction Disturbances: none Narrative Interpretation: unremarkable  ____________________________________________  RADIOLOGY  Chest x-ray with no acute disease ____________________________________________   PROCEDURES  Procedure(s) performed: no  Procedures  Critical Care performed: no  ____________________________________________   INITIAL IMPRESSION / ASSESSMENT AND PLAN / ED COURSE  Pertinent labs & imaging results that were available during my care of the patient were reviewed by me and considered in my medical decision making (see chart for details).  The patient is quite atypical chest pain. I'm not completely convinced  of his diagnosis of Prinzmetal's angina, however it is reasonable for him to begin taking the diltiazem to reevaluate. He said his last HIV test was in February but he does not know the results and it asked for an HIV test here. Fortunately it is negative. His pain is improved with Tylenol. At this point I'm comfortable discharging him home with follow-up with his primary care physician. He is discharged home in improved and good condition.      ____________________________________________   FINAL CLINICAL IMPRESSION(S) / ED DIAGNOSES  Final diagnoses:  Atypical chest pain      NEW MEDICATIONS STARTED DURING THIS VISIT:  New Prescriptions   No medications on file     Note:  This document was prepared using Dragon voice recognition software and may include unintentional dictation errors.     Merrily Brittle, MD 04/08/16 [phone]

## 2016-04-08 NOTE — Discharge Instructions (Signed)
Please begin taking your diltiazem as prescribed by your primary care physician in follow-up within 1 week. Return to the emergency department for any new or worsening symptoms.  Congratulations on your 30 days of sobriety.  It was a pleasure to take care of you today, and thank you for coming to our emergency department.  If you have any questions or concerns before leaving please ask the nurse to grab me and I'm more than happy to go through your aftercare instructions again.  If you were prescribed any opioid pain medication today such as Norco, Vicodin, Percocet, morphine, hydrocodone, or oxycodone please make sure you do not drive when you are taking this medication as it can alter your ability to drive safely.  If you have any concerns once you are home that you are not improving or are in fact getting worse before you can make it to your follow-up appointment, please do not hesitate to call [phone] and come back for further evaluation.  Merrily BrittleNeil Juanice Warburton MD  Results for orders placed or performed during the hospital encounter of 04/08/16  Basic metabolic panel  Result Value Ref Range   Sodium 141 135 - 145 mmol/L   Potassium 4.2 3.5 - 5.1 mmol/L   Chloride 107 101 - 111 mmol/L   CO2 28 22 - 32 mmol/L   Glucose, Bld 110 (H) 65 - 99 mg/dL   BUN 17 6 - 20 mg/dL   Creatinine, Ser 1.610.96 0.61 - 1.24 mg/dL   Calcium 9.0 8.9 - 09.610.3 mg/dL   GFR calc non Af Amer >60 >60 mL/min   GFR calc Af Amer >60 >60 mL/min   Anion gap 6 5 - 15  CBC  Result Value Ref Range   WBC 6.2 3.8 - 10.6 K/uL   RBC 4.53 4.40 - 5.90 MIL/uL   Hemoglobin 14.0 13.0 - 18.0 g/dL   HCT 04.540.5 40.940.0 - 81.152.0 %   MCV 89.4 80.0 - 100.0 fL   MCH 30.9 26.0 - 34.0 pg   MCHC 34.6 32.0 - 36.0 g/dL   RDW 91.415.2 (H) 78.211.5 - 95.614.5 %   Platelets 163 150 - 440 K/uL  Troponin I  Result Value Ref Range   Troponin I <0.03 <0.03 ng/mL  Rapid HIV screen (HIV 1/2 Ab+Ag)  Result Value Ref Range   HIV-1 P24 Antigen - HIV24 NON REACTIVE NON  REACTIVE   HIV 1/2 Antibodies NON REACTIVE NON REACTIVE   Interpretation (HIV Ag Ab)      A non reactive test result means that HIV 1 or HIV 2 antibodies and HIV 1 p24 antigen were not detected in the specimen.   Dg Chest 2 View  Result Date: 04/08/2016 CLINICAL DATA:  26 year old male with chest pain. EXAM: CHEST  2 VIEW COMPARISON:  Thoracic spine radiograph dated 11/18/2007 FINDINGS: The heart size and mediastinal contours are within normal limits. Both lungs are clear. The visualized skeletal structures are unremarkable. IMPRESSION: No active cardiopulmonary disease. Electronically Signed   By: Elgie CollardArash  Radparvar M.D.   On: 04/08/2016 01:47

## 2016-04-08 NOTE — ED Triage Notes (Signed)
Pt recently dx w/ prinzmental angina, rx'd cardizem. 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 Pt has not started cardizem yet. Pt presents w/ c/o reproducible, sharp, shooting chest pain.

## 2016-04-09 LAB — HIV ANTIBODY (ROUTINE TESTING W REFLEX): HIV SCREEN 4TH GENERATION: NONREACTIVE

## 2016-05-11 ENCOUNTER — Ambulatory Visit: Payer: Self-pay

## 2017-03-18 ENCOUNTER — Emergency Department
Admission: EM | Admit: 2017-03-18 | Discharge: 2017-03-18 | Disposition: A | Discharge status: Home / Self Care | Payer: Self-pay | Attending: Emergency Medicine | Admitting: Emergency Medicine

## 2017-03-18 ENCOUNTER — Encounter: Payer: Self-pay | Admitting: Emergency Medicine

## 2017-03-18 DIAGNOSIS — Z79899 Other long term (current) drug therapy: Secondary | ICD-10-CM | POA: Insufficient documentation

## 2017-03-18 DIAGNOSIS — F17200 Nicotine dependence, unspecified, uncomplicated: Secondary | ICD-10-CM | POA: Insufficient documentation

## 2017-03-18 DIAGNOSIS — K0889 Other specified disorders of teeth and supporting structures: Secondary | ICD-10-CM | POA: Insufficient documentation

## 2017-03-18 MED ORDER — ACETAMINOPHEN-CODEINE #3 300-30 MG PO TABS: 1.0000 | ORAL_TABLET | Freq: Four times a day (QID) | ORAL | 0 refills | Status: DC | PRN

## 2017-03-18 MED ORDER — PENICILLIN V POTASSIUM 500 MG PO TABS: 500.0000 mg | ORAL_TABLET | Freq: Three times a day (TID) | ORAL | 0 refills | Status: DC

## 2017-03-18 NOTE — ED Notes (Signed)
See triage note  conts to have pain s/p tooth extraction    Had procedure done last week

## 2017-03-18 NOTE — ED Provider Notes (Signed)
[facility] Emergency Department Provider Note ____________________________________________  Time seen: Approximately 3:36 PM  I have reviewed the triage vital signs and the nursing notes.   HISTORY  Chief Complaint Dental Pain   HPI Jesse Bullock is a 27 y.o. male who presents to the emergency department for treatment and evaluation of jaw pain after having a tooth extracted Wednesday or Thursday of last week.  He had 10 days of amoxicillin prior to extraction and then received 2 g of amoxicillin on the day of the procedure.  Since that time, he has had an increase in jaw pain and states that he can "taste infection."  He states that his mother called the dental office that performed the extraction, but because he has history of hepatitis C and has had endocarditis they do not feel that they are qualified to see and treat him and recommended that he come to the emergency department.  Patient states that the pain is in the left lower jaw and shoots to the lower incisors.  He has not noticed any fever and has not had any significant swelling since the procedure.  He also denies fever.  Past Medical History:  Diagnosis Date  . Allergy   . Bacterial heart infection   . High cholesterol   . Substance abuse (HCC)    prior, but has now detoxed    There are no active problems to display for this patient.   History reviewed. No pertinent surgical history.  Prior to Admission medications   Medication Sig Start Date End Date Taking? Authorizing Provider  acetaminophen-codeine (TYLENOL #3) 300-30 MG tablet Take 1 tablet by mouth every 6 (six) hours as needed for moderate pain. 03/18/17   Astin Rape B, FNP  azithromycin (ZITHROMAX Z-PAK) 250 MG tablet Take 2 tablets (500 mg) on  Day 1,  followed by 1 tablet (250 mg) once daily on Days 2 through 5. Patient not taking: Reported on 04/06/2016 10/11/15   Beers, Charmayne Sheerharles M, PA-C  diltiazem (DILTIAZEM CD) 240 MG 24 hr  capsule Take 1 capsule (240 mg total) by mouth daily. 04/06/16   Michiel CowboyMcGowan, Shannon A, PA-C  lidocaine (XYLOCAINE) 2 % solution Use as directed 20 mLs in the mouth or throat as needed for mouth pain. Patient not taking: Reported on 04/06/2016 10/11/15   Evangeline DakinBeers, Charles M, PA-C  penicillin v potassium (VEETID) 500 MG tablet Take 1 tablet (500 mg total) by mouth 3 (three) times daily. 03/18/17   Dominik Lauricella, Rulon Eisenmengerari B, FNP    Allergies Ibuprofen; Aspirin; Haldol [haloperidol lactate]; and Penicillins  Family History  Problem Relation Age of Onset  . Heart disease Father   . Heart disease Paternal Grandmother     Social History Social History   Tobacco Use  . Smoking status: Current Every Day Smoker    Packs/day: 0.50  . Smokeless tobacco: Never Used  Substance Use Topics  . Alcohol use: No    Comment: 30+ days  . Drug use: No    Comment: detoxed- in treatment program    Review of Systems Constitutional: Negative for fever ENT: Positive for dental pain Musculoskeletal: Positive for left lower jaw pain.  Negative for trismus Skin:  negative for rash, lesion, or wound.  Positive for recent dental extraction ____________________________________________   PHYSICAL EXAM:  VITAL SIGNS: ED Triage Vitals  Enc Vitals Group     BP 03/18/17 1241 113/78     Pulse Rate 03/18/17 1241 71     Resp 03/18/17 1241  20     Temp 03/18/17 1241 98.3 F (36.8 C)     Temp Source 03/18/17 1241 Oral     SpO2 03/18/17 1241 97 %     Weight 03/18/17 1242 108 lb (49 kg)     Height 03/18/17 1242 5\' 8"  (1.727 m)     Head Circumference --      Peak Flow --      Pain Score 03/18/17 1242 10     Pain Loc --      Pain Edu? --      Excl. in GC? --     Constitutional: Alert and oriented. Well appearing and in no acute distress. Eyes: Conjunctivae are clear without discharge or drainage. Mouth/Throat: See periodontal exam Periodontal Exam    Hematological/Lymphatic/Immunilogical: No lymphadenopathy on  exam Respiratory: Respirations are even and unlabored Musculoskeletal: Full, active range of motion of the jaw observed. Neurologic: Awake, alert, oriented. Skin: No edema or erythema overlying the area of dental extraction Psychiatric: Affect and behavior appropriate.  ____________________________________________   LABS (all labs ordered are listed, but only abnormal results are displayed)  Labs Reviewed - No data to display ____________________________________________   RADIOLOGY  Not indicated. ____________________________________________   PROCEDURES  Procedure(s) performed: Refused dental block  Critical Care performed: No ____________________________________________   INITIAL IMPRESSION / ASSESSMENT AND PLAN / ED COURSE  Jesse Bullock is a 27 y.o. male who presents to the emergency department for treatment and evaluation of dental pain post extraction.  Although I have a low suspicion for dental infection, he will he can be covered with penicillin V.  Of note, the chart indicates that he has an allergy to penicillin, however he has recently taken amoxicillin without any adverse reaction.  Patient will be given a prescription for Tylenol with codeine but only given 12.  He is to call the dentist again tomorrow for a follow-up appointment.  He was instructed to return to the emergency department for symptoms of change or worsen if he is unable to schedule an appointment.  Pertinent labs & imaging results that were available during my care of the patient were reviewed by me and considered in my medical decision making (see chart for details).  ____________________________________________   FINAL CLINICAL IMPRESSION(S) / ED DIAGNOSES  Final diagnoses:  Pain, dental    Discharge Medication List as of 03/18/2017  3:31 PM    START taking these medications   Details  acetaminophen-codeine (TYLENOL #3) 300-30 MG tablet Take 1 tablet by mouth every 6 (six) hours as  needed for moderate pain., Starting Sun 03/18/2017, Print    penicillin v potassium (VEETID) 500 MG tablet Take 1 tablet (500 mg total) by mouth 3 (three) times daily., Starting Sun 03/18/2017, Print        If controlled substance prescribed during this visit, 12 month history viewed on the [facility] prior to issuing an initial prescription for Schedule II or III opiod.  Note:  This document was prepared using Dragon voice recognition software and may include unintentional dictation errors.    Chinita Pester, FNP 03/18/17 1543    Governor Rooks, MD 03/19/17 [phone]

## 2017-03-18 NOTE — ED Notes (Signed)
FN: pt presents with dental pain.

## 2017-03-18 NOTE — Discharge Instructions (Signed)
Please call the dentist again tomorrow.

## 2017-03-18 NOTE — ED Triage Notes (Signed)
Pt reports got a tooth pulled on last Wednesday on the left side bottom. Pt reports was told it was a hard extraction but the pain should go away. Pt reports called the dentist today and was to come to the ED incase he had an infection. Pt reports he did not receive antibiotics from the dentist.

## 2017-10-02 ENCOUNTER — Other Ambulatory Visit: Payer: Self-pay

## 2017-10-02 ENCOUNTER — Emergency Department
Admission: EM | Admit: 2017-10-02 | Discharge: 2017-10-02 | Disposition: A | Discharge status: Home / Self Care | Payer: Self-pay | Attending: Student in an Organized Health Care Education/Training Program | Admitting: Student in an Organized Health Care Education/Training Program

## 2017-10-02 ENCOUNTER — Encounter: Payer: Self-pay | Admitting: Emergency Medicine

## 2017-10-02 DIAGNOSIS — Z79899 Other long term (current) drug therapy: Secondary | ICD-10-CM | POA: Insufficient documentation

## 2017-10-02 DIAGNOSIS — E7800 Pure hypercholesterolemia, unspecified: Secondary | ICD-10-CM | POA: Insufficient documentation

## 2017-10-02 DIAGNOSIS — F17200 Nicotine dependence, unspecified, uncomplicated: Secondary | ICD-10-CM | POA: Insufficient documentation

## 2017-10-02 DIAGNOSIS — K047 Periapical abscess without sinus: Secondary | ICD-10-CM | POA: Insufficient documentation

## 2017-10-02 MED ORDER — CLINDAMYCIN HCL 300 MG PO CAPS: 300.0000 mg | ORAL_CAPSULE | Freq: Three times a day (TID) | ORAL | 0 refills | Status: AC

## 2017-10-02 NOTE — ED Provider Notes (Signed)
[facility] Emergency Department Provider Note ____________________________________________  Time seen: Approximately 5:29 PM  I have reviewed the triage vital signs and the nursing notes.   HISTORY  Chief Complaint Dental Pain   HPI Jesse Bullock is a 27 y.o. male who presents to the emergency department for treatment and evaluation of dental pain.  Over the past couple of days he has had pain in the right upper jaw.  He has chronic dental erosion and now feels that he has an abscess within the gum.  He states that he had a fever yesterday but has not had any today.  No alleviating measures attempted prior to arrival.   Past Medical History:  Diagnosis Date  . Allergy   . Bacterial heart infection   . High cholesterol   . Substance abuse (HCC)    prior, but has now detoxed    There are no active problems to display for this patient.   History reviewed. No pertinent surgical history.  Prior to Admission medications   Medication Sig Start Date End Date Taking? Authorizing Provider  acetaminophen-codeine (TYLENOL #3) 300-30 MG tablet Take 1 tablet by mouth every 6 (six) hours as needed for moderate pain. 03/18/17   Rogene Meth B, FNP  azithromycin (ZITHROMAX Z-PAK) 250 MG tablet Take 2 tablets (500 mg) on  Day 1,  followed by 1 tablet (250 mg) once daily on Days 2 through 5. Patient not taking: Reported on 04/06/2016 10/11/15   Beers, Charmayne Sheer, PA-C  clindamycin (CLEOCIN) 300 MG capsule Take 1 capsule (300 mg total) by mouth 3 (three) times daily for 10 days. 10/02/17 10/12/17  Ghali Morissette, Rulon Eisenmenger B, FNP  diltiazem (DILTIAZEM CD) 240 MG 24 hr capsule Take 1 capsule (240 mg total) by mouth daily. 04/06/16   Michiel Cowboy A, PA-C  lidocaine (XYLOCAINE) 2 % solution Use as directed 20 mLs in the mouth or throat as needed for mouth pain. Patient not taking: Reported on 04/06/2016 10/11/15   Evangeline Dakin, PA-C  penicillin v potassium (VEETID) 500 MG tablet  Take 1 tablet (500 mg total) by mouth 3 (three) times daily. 03/18/17   [place] Sloop, Rulon Eisenmenger B, FNP    Allergies Ibuprofen; Aspirin; and Haldol [haloperidol lactate]  Family History  Problem Relation Age of Onset  . Heart disease Father   . Heart disease Paternal Grandmother     Social History Social History   Tobacco Use  . Smoking status: Current Every Day Smoker    Packs/day: 0.50  . Smokeless tobacco: Never Used  Substance Use Topics  . Alcohol use: No    Comment: 30+ days  . Drug use: No    Comment: detoxed- in treatment program    Review of Systems Constitutional: Negative for fever or recent illness. ENT: Positive for dental pain. Musculoskeletal: Negative for trismus of the jaw.  Skin: Negative for wound or lesion. ____________________________________________   PHYSICAL EXAM:  VITAL SIGNS: ED Triage Vitals [10/02/17 1717]  Enc Vitals Group     BP 127/84     Pulse Rate 83     Resp 16     Temp 98.2 F (36.8 C)     Temp Source Oral     SpO2 97 %     Weight 115 lb (52.2 kg)     Height 5\' 7"  (1.702 m)     Head Circumference      Peak Flow      Pain Score 6     Pain Loc  Pain Edu?      Excl. in GC?     Constitutional: Alert and oriented. Well appearing and in no acute distress. Eyes: Conjunctiva are clear without discharge or drainage. Mouth/Throat: Airway is patent.  Tonsils are flat and without exudate. Periodontal Exam    Hematological/Lymphatic/Immunilogical: No palpable anterior cervical lymphadenopathy. Respiratory: Respirations even and unlabored. Musculoskeletal: Full ROM of the jaw. Neurologic: Awake, alert, oriented.  Skin: No erythema or edema about the face or neck. Psychiatric: Affect and behavior intact.  ____________________________________________   LABS (all labs ordered are listed, but only abnormal results are displayed)  Labs Reviewed - No data to display ____________________________________________   RADIOLOGY  Not  indicated. ____________________________________________   PROCEDURES  Procedure(s) performed:   Procedures  Critical Care performed: No ____________________________________________   INITIAL IMPRESSION / ASSESSMENT AND PLAN / ED COURSE  Jesse Bullock is a 27 y.o. male who presents to the emergency department for treatment and evaluation of dental pain.  He will be treated with clindamycin as he states that amoxicillin typically does not really help.  He was also given a list of dental clinics and was instructed to call and schedule an appointment with someone within the next 10 to 14 days.  He was encouraged to return to the emergency department for symptoms of change or worsen if unable to schedule appointment.  Pertinent labs & imaging results that were available during my care of the patient were reviewed by me and considered in my medical decision making (see chart for details).  ____________________________________________   FINAL CLINICAL IMPRESSION(S) / ED DIAGNOSES  Final diagnoses:  Dental infection    Discharge Medication List as of 10/02/2017  5:53 PM    START taking these medications   Details  clindamycin (CLEOCIN) 300 MG capsule Take 1 capsule (300 mg total) by mouth 3 (three) times daily for 10 days., Starting Tue 10/02/2017, Until Fri 10/12/2017, Normal        If controlled substance prescribed during this visit, 12 month history viewed on the [facility] prior to issuing an initial prescription for Schedule II or III opiod.  Note:  This document was prepared using Dragon voice recognition software and may include unintentional dictation errors.    Chinita Pester, FNP 10/02/17 1958    Willy Eddy, MD 10/02/17 2101

## 2017-10-02 NOTE — ED Notes (Signed)
FIRST NURSE NOTE:  Pt states he has an infection in his jaw, pt rubbing right side of jaw.

## 2017-10-02 NOTE — Discharge Instructions (Signed)
OPTIONS FOR DENTAL FOLLOW UP CARE ° °[place] [facility] and [facility] - Local Safety Net Dental Clinics °http://www.ncdhhs.gov/dph/oralhealth/services/safetynetclinics.htm °  °[facility] (336-562-3123) ° °[facility] [place] (919-933-9087) ° °[facility] ([phone] ext [phone]) ° °[facility] ([phone]) ° °[facility] ([phone]) °This clinic caters to the indigent population and is on a lottery system. °Location: °[facility], [address], [place] °Clinic Hours: °Wednesdays from 6pm - 9pm, patients seen by a lottery system. °For dates, call or go to www.med.[facility].edu/[facility]/patients/Dental-[facility] °Services: °Cleanings, fillings and simple extractions. °Payment Options: °DENTAL WORK IS FREE OF CHARGE. Bring proof of income or support. °Best way to get seen: °Arrive at 5:15 pm - this is a lottery, NOT first come/first serve, so arriving earlier will not increase your chances of being seen. °  °  °[facility] °[phone] °Select option 1 for emergencies °  °Location: °[facility], [address], [place] °Clinic Hours: °No walk-ins accepted - call the day before to schedule an appointment. °Check in times are 9:30 am and 1:30 pm. °Services: °Simple extractions, temporary fillings, pulpectomy/pulp debridement, uncomplicated abscess drainage. °Payment Options: °PAYMENT IS DUE AT THE TIME OF SERVICE.  Fee is usually $100-200, additional surgical procedures (e.g. abscess drainage) may be extra. °Cash, checks, Visa/MasterCard accepted.  Can file Medicaid if patient is covered for dental - patient should call case worker to check. °No discount for [facility] patients. °Best way to get seen: °MUST call the day before and get onto the schedule. Can usually be seen the next 1-2 days. No walk-ins accepted. °  °  °[facility] °[phone] °   °Location: °[facility], [address], [place] °Clinic Hours: °M, W, Th, F 8am or 1:30pm, Tues 9a or 1:30 - first come/first served. °Services: °Simple extractions, temporary fillings, uncomplicated abscess drainage.  You do not need to be an [place] resident. °Payment Options: °PAYMENT IS DUE AT THE TIME OF SERVICE. °Dental insurance, otherwise sliding scale - bring proof of income or support. °Depending on income and treatment needed, cost is usually $50-200. °Best way to get seen: °Arrive early as it is first come/first served. °  °  °[facility] °[phone] °  °Location: °[address] °Clinic Hours: °Mon-Thu 8a-5p °Services: °Most basic dental services including extractions and fillings. °Payment Options: °PAYMENT IS DUE AT THE TIME OF SERVICE. °Sliding scale, up to 50% off - bring proof if income or support. °Medicaid with dental option accepted. °Best way to get seen: °Call to schedule an appointment, can usually be seen within 2 weeks OR they will try to see walk-ins - show up at 8a or 2p (you may have to wait). °  °  °[facility] °919-245-2435 °[place] RESIDENTS ONLY °  °Location: °[facility], [address], [place],  [address] °Clinic Hours: By appointment only. °Monday - Thursday 8am-5pm, Friday 8am-12pm °Services: Cleanings, fillings, extractions. °Payment Options: °PAYMENT IS DUE AT THE TIME OF SERVICE. °Cash, Visa or MasterCard. Sliding scale - $30 minimum per service. °Best way to get seen: °Come in to office, complete packet and make an appointment - need proof of income °or support monies for each household member and proof of [place] residence. °Usually takes about a month to get in. °  °  °[facility] °[phone] °  °Location: °[address]   [place] °Clinic Hours: Walk-in Urgent Care Dental Services are offered Monday-Friday  mornings only. °The numbers of emergencies accepted daily is limited to the number of °providers available. °Maximum 15 - Mondays, Wednesdays & Thursdays °Maximum 10 - Tuesdays & Fridays °Services: °You do not need to be a [place] resident to be seen for a dental emergency. °Emergencies are defined as pain, swelling, abnormal bleeding, or dental trauma. Walkins will receive x-rays if needed. °NOTE: Dental cleaning is not an emergency. °Payment Options: °PAYMENT IS DUE AT THE TIME OF SERVICE. °Minimum co-pay is $40.00 for uninsured patients. °Minimum co-pay is $3.00 for Medicaid with dental coverage. °Dental Insurance is accepted and must be presented at time of visit. °Medicare does not cover dental. °Forms of payment: Cash, credit card, checks. °Best way to get seen: °If not previously registered with the clinic, walk-in dental registration begins at 7:15 am and is on a first come/first serve basis. °If previously registered with the clinic, call to make an appointment. °  °  °[facility] °[phone] °[place] RESIDENTS ONLY °  °Location: °[address], [place], [place] °Clinic Hours: °Mon-Thu 10a-2p °Services: Extractions only! °Payment Options: °FREE (donations accepted) - bring proof of income or support °Best way to get seen: °Call and schedule an appointment OR come at 8am on the 1st Monday of every month (except for holidays) when it is first come/first served. °  °  °[facility] °[phone] °  °Location: °[address], [place] °Clinic Hours: °Friday mornings °Services, Payment Options, Best way to get seen: °Call for info ° °Please call and schedule a dental appointment as soon as possible. You will need to be seen within the next 14 days. Return to the emergency department for symptoms that change or worsen if you're unable to schedule an appointment. ° °

## 2017-10-02 NOTE — ED Triage Notes (Signed)
Pt comes into the ED via POV c/o dental pain on the right upper side.  Patient has missing teeth at this time but states it is in the tooth next to it.  Patient has no swelling noted at this time and in NAD with even and unlabored respirations.

## 2017-10-24 ENCOUNTER — Emergency Department
Admission: EM | Admit: 2017-10-24 | Discharge: 2017-10-24 | Disposition: A | Discharge status: Home / Self Care | Payer: Self-pay | Attending: Emergency Medicine | Admitting: Emergency Medicine

## 2017-10-24 ENCOUNTER — Other Ambulatory Visit: Payer: Self-pay

## 2017-10-24 DIAGNOSIS — K0889 Other specified disorders of teeth and supporting structures: Secondary | ICD-10-CM

## 2017-10-24 DIAGNOSIS — Z79899 Other long term (current) drug therapy: Secondary | ICD-10-CM | POA: Insufficient documentation

## 2017-10-24 DIAGNOSIS — K029 Dental caries, unspecified: Secondary | ICD-10-CM

## 2017-10-24 DIAGNOSIS — F17210 Nicotine dependence, cigarettes, uncomplicated: Secondary | ICD-10-CM | POA: Insufficient documentation

## 2017-10-24 MED ORDER — LIDOCAINE HCL (PF) 1 % IJ SOLN
INTRAMUSCULAR | Status: AC
  Filled 2017-10-24: qty 5

## 2017-10-24 MED ORDER — TRAMADOL HCL 50 MG PO TABS
100.0000 mg | ORAL_TABLET | Freq: Once | ORAL | Status: AC
  Administered 2017-10-24: 100 mg via ORAL
  Filled 2017-10-24: qty 2

## 2017-10-24 MED ORDER — LIDOCAINE VISCOUS HCL 2 % MT SOLN
OROMUCOSAL | Status: AC
  Administered 2017-10-24: 15 mL via OROMUCOSAL
  Filled 2017-10-24: qty 15

## 2017-10-24 MED ORDER — LIDOCAINE VISCOUS HCL 2 % MT SOLN
15.0000 mL | Freq: Once | OROMUCOSAL | Status: AC
  Administered 2017-10-24: 15 mL via OROMUCOSAL

## 2017-10-24 NOTE — ED Notes (Signed)
Dr. Brown at the bedside for pt evaluation 

## 2017-10-24 NOTE — ED Provider Notes (Signed)
[facility] Emergency Department Provider Note    First MD Initiated Contact with Patient 10/24/17 [phone]     (approximate)  I have reviewed the triage vital signs and the nursing notes.   HISTORY  Chief Complaint Dental Pain    HPI Jesse Bullock is a 27 y.o. male presents with 10 out of 10 pain at the right mandibular premolar dental cavity.  Patient denies any fever afebrile on presentation no difficulty swallowing or breathing.  Patient states that he has been seen in the emergency department for the same in the past and is requesting dental block to be performed.  Patient has an appointment with dentist today  Past Medical History:  Diagnosis Date  . Allergy   . Bacterial heart infection   . High cholesterol   . Substance abuse (HCC)    prior, but has now detoxed    There are no active problems to display for this patient.   No past surgical history on file.  Prior to Admission medications   Medication Sig Start Date End Date Taking? Authorizing Provider  acetaminophen-codeine (TYLENOL #3) 300-30 MG tablet Take 1 tablet by mouth every 6 (six) hours as needed for moderate pain. 03/18/17   Triplett, Cari B, FNP  azithromycin (ZITHROMAX Z-PAK) 250 MG tablet Take 2 tablets (500 mg) on  Day 1,  followed by 1 tablet (250 mg) once daily on Days 2 through 5. Patient not taking: Reported on 04/06/2016 10/11/15   Beers, Charmayne Sheer, PA-C  diltiazem (DILTIAZEM CD) 240 MG 24 hr capsule Take 1 capsule (240 mg total) by mouth daily. 04/06/16   Michiel Cowboy A, PA-C  lidocaine (XYLOCAINE) 2 % solution Use as directed 20 mLs in the mouth or throat as needed for mouth pain. Patient not taking: Reported on 04/06/2016 10/11/15   Evangeline Dakin, PA-C  penicillin v potassium (VEETID) 500 MG tablet Take 1 tablet (500 mg total) by mouth 3 (three) times daily. 03/18/17   Triplett, Rulon Eisenmenger B, FNP    Allergies Ibuprofen; Aspirin; and Haldol [haloperidol  lactate]  Family History  Problem Relation Age of Onset  . Heart disease Father   . Heart disease Paternal Grandmother     Social History Social History   Tobacco Use  . Smoking status: Current Every Day Smoker    Packs/day: 0.50  . Smokeless tobacco: Never Used  Substance Use Topics  . Alcohol use: No    Comment: 30+ days  . Drug use: No    Comment: detoxed- in treatment program    Review of Systems Constitutional: No fever/chills Eyes: No visual changes. ENT: No sore throat.  Positive for dental pain Cardiovascular: Denies chest pain. Respiratory: Denies shortness of breath. Gastrointestinal: No abdominal pain.  No nausea, no vomiting.  No diarrhea.  No constipation. Genitourinary: Negative for dysuria. Musculoskeletal: Negative for neck pain.  Negative for back pain. Integumentary: Negative for rash. Neurological: Negative for headaches, focal weakness or numbness.  ____________________________________________   PHYSICAL EXAM:  VITAL SIGNS: ED Triage Vitals  Enc Vitals Group     BP 10/24/17 0131 125/88     Pulse Rate 10/24/17 0131 81     Resp 10/24/17 0131 20     Temp 10/24/17 0131 98.1 F (36.7 C)     Temp Source 10/24/17 0131 Oral     SpO2 10/24/17 0131 100 %     Weight 10/24/17 0132 49.9 kg (110 lb)     Height --  Head Circumference --      Peak Flow --      Pain Score 10/24/17 0132 8     Pain Loc --      Pain Edu? --      Excl. in GC? --     Constitutional: Alert and oriented.  Crying Eyes: Conjunctivae are normal. Mouth/Throat: Mucous membranes are moist. Oropharynx non-erythematous.  Multiple dental caries. Neck: No stridor.   Cardiovascular: Normal rate, regular rhythm. Good peripheral circulation. Grossly normal heart sounds. Respiratory: Normal respiratory effort.  No retractions. Lungs CTAB.  Neurologic:  Normal speech and language. No gross focal neurologic deficits are appreciated.  Skin:  Skin is warm, dry and intact. No rash  noted.     Celedonio Miyamoto Block Date/Time: 10/24/2017 10:45 PM Performed by: Darci Current, MD Authorized by: Darci Current, MD   Consent:    Consent obtained:  Verbal   Consent given by:  Patient   Risks discussed:  Allergic reaction, swelling, unsuccessful block, pain, intravenous injection and bleeding   Alternatives discussed:  Alternative treatment Indications:    Indications:  Pain relief Location:    Nerve block body site: Inferior alveolar nerve. Skin anesthesia (see MAR for exact dosages):    Skin anesthesia method:  None Procedure details (see MAR for exact dosages):    Block needle gauge:  25 G   Anesthetic injected:  Lidocaine 1% w/o epi   Injection procedure:  Anatomic landmarks identified, incremental injection, negative aspiration for blood, introduced needle and anatomic landmarks palpated   Paresthesia:  None Post-procedure details:    Patient tolerance of procedure:  Tolerated well, no immediate complications     ____________________________________________   INITIAL IMPRESSION / ASSESSMENT AND PLAN / ED COURSE  As part of my medical decision making, I reviewed the following data within the electronic MEDICAL RECORD NUMBER   27 year old male presenting with above-stated history and physical exam secondary to dental pain.  Inferior alveolar nerve block performed with pain improvement.  ____________________________________________  FINAL CLINICAL IMPRESSION(S) / ED DIAGNOSES  Final diagnoses:  Pain, dental  Dental caries     MEDICATIONS GIVEN DURING THIS VISIT:  Medications  lidocaine (PF) (XYLOCAINE) 1 % injection (has no administration in time range)  lidocaine (XYLOCAINE) 2 % viscous mouth solution 15 mL (15 mLs Mouth/Throat Given 10/24/17 0526)     ED Discharge Orders    None       Note:  This document was prepared using Dragon voice recognition software and may include unintentional dictation errors.    Darci Current, MD 10/24/17  2246

## 2017-10-24 NOTE — ED Notes (Signed)
Pt resting quietly on stretcher with lights off to enhance rest. No distress noted at this time. Pt states he is feeling a little better

## 2017-10-24 NOTE — ED Notes (Addendum)
Pt presents to ED with tooth pain. Just finished antibiotics Tuesday. Dentist appt Wednesday. Pain intermittent. Pt moaning in exam room and states he is in severe pain. Swelling noted to affected area at this time.

## 2017-10-24 NOTE — ED Notes (Signed)
Dr. Brown at the bedside

## 2017-10-24 NOTE — ED Triage Notes (Signed)
Pt in with co toothache states here several times for the same states does have an apptm tomorrow with dentist. Here for pain control pt took last dose of antibiotics today.

## 2017-10-27 ENCOUNTER — Other Ambulatory Visit: Payer: Self-pay

## 2017-10-27 ENCOUNTER — Encounter: Payer: Self-pay | Admitting: Emergency Medicine

## 2017-10-27 ENCOUNTER — Emergency Department
Admission: EM | Admit: 2017-10-27 | Discharge: 2017-10-27 | Disposition: A | Discharge status: Home / Self Care | Payer: Self-pay | Attending: Emergency Medicine | Admitting: Emergency Medicine

## 2017-10-27 DIAGNOSIS — Z79899 Other long term (current) drug therapy: Secondary | ICD-10-CM | POA: Insufficient documentation

## 2017-10-27 DIAGNOSIS — X58XXXA Exposure to other specified factors, initial encounter: Secondary | ICD-10-CM | POA: Insufficient documentation

## 2017-10-27 DIAGNOSIS — Y999 Unspecified external cause status: Secondary | ICD-10-CM | POA: Insufficient documentation

## 2017-10-27 DIAGNOSIS — K0889 Other specified disorders of teeth and supporting structures: Secondary | ICD-10-CM

## 2017-10-27 DIAGNOSIS — Y929 Unspecified place or not applicable: Secondary | ICD-10-CM | POA: Insufficient documentation

## 2017-10-27 DIAGNOSIS — S025XXB Fracture of tooth (traumatic), initial encounter for open fracture: Secondary | ICD-10-CM | POA: Insufficient documentation

## 2017-10-27 DIAGNOSIS — F17200 Nicotine dependence, unspecified, uncomplicated: Secondary | ICD-10-CM | POA: Insufficient documentation

## 2017-10-27 DIAGNOSIS — Y939 Activity, unspecified: Secondary | ICD-10-CM | POA: Insufficient documentation

## 2017-10-27 MED ORDER — LIDOCAINE-EPINEPHRINE 2 %-1:100000 IJ SOLN
1.7000 mL | Freq: Once | INTRAMUSCULAR | Status: AC
  Administered 2017-10-27: 1.7 mL
  Filled 2017-10-27: qty 1.7

## 2017-10-27 MED ORDER — CEFTRIAXONE SODIUM 1 G IJ SOLR
1.0000 g | Freq: Once | INTRAMUSCULAR | Status: AC
  Administered 2017-10-27: 1 g via INTRAMUSCULAR
  Filled 2017-10-27: qty 10

## 2017-10-27 MED ORDER — LIDOCAINE HCL (PF) 1 % IJ SOLN
INTRAMUSCULAR | Status: AC
  Administered 2017-10-27: 2.1 mL
  Filled 2017-10-27: qty 5

## 2017-10-27 NOTE — ED Triage Notes (Signed)
Pt to ed with POV c/o dental pain. Pt has seen dentist and has appt with oral surgeon but cannot be seen for 3 weeks.

## 2017-10-27 NOTE — Discharge Instructions (Addendum)
You have been diagnosed with dental pain secondary to a broken tooth. We have performed a dental block and gave you an injection of antibiotics. Continue Amoxicillin and Tramadol as prescribed. Call [facility] oral surgery to see if your appt can be moved up.

## 2017-10-27 NOTE — ED Provider Notes (Signed)
[facility] Emergency Department Provider Note ____________________________________________  Time seen: 2130  I have reviewed the triage vital signs and the nursing notes.  HISTORY  Chief Complaint  Dental Pain   HPI Jesse Bullock is a 27 y.o. male presents to the clinic today with c/o dental pain. He reports this has been a persistent issue for months. He has multiple broken teeth, needs extraction by oral surgery, but can't been seen until 10/21. He is currently complaining of right lower jaw pain. He describes the pain as throbbing. He denies swelling of his gums, tongue or mouth. He denies fever, chills or body aches. He received a dental block 10/2. He is currently on Amoxil and Tramadol for infection and pain. He has been seen by a dentist but they advised him he has to have the procedures done by an oral surgeon. He is taking Tylenol OTC with minimal relief.   Past Medical History:  Diagnosis Date  . Allergy   . Bacterial heart infection   . High cholesterol   . Substance abuse (HCC)    prior, but has now detoxed    There are no active problems to display for this patient.   History reviewed. No pertinent surgical history.  Prior to Admission medications   Medication Sig Start Date End Date Taking? Authorizing Provider  diltiazem (DILTIAZEM CD) 240 MG 24 hr capsule Take 1 capsule (240 mg total) by mouth daily. 04/06/16   Michiel Cowboy A, PA-C    Allergies Ibuprofen; Aspirin; and Haldol [haloperidol lactate]  Family History  Problem Relation Age of Onset  . Heart disease Father   . Heart disease Paternal Grandmother     Social History Social History   Tobacco Use  . Smoking status: Current Every Day Smoker    Packs/day: 0.50  . Smokeless tobacco: Never Used  Substance Use Topics  . Alcohol use: No    Comment: 30+ days  . Drug use: No    Comment: detoxed- in treatment program    Review of Systems  Constitutional: Negative  for fever, chills or body aches. ENT: Positive for dental pain.  Skin: Negative for rash. Neurological: Negative for headaches, focal weakness or numbness. ____________________________________________  PHYSICAL EXAM:  VITAL SIGNS: ED Triage Vitals  Enc Vitals Group     BP 10/27/17 1831 137/88     Pulse Rate 10/27/17 1831 96     Resp 10/27/17 1831 16     Temp 10/27/17 1832 98.6 F (37 C)     Temp Source 10/27/17 1832 Oral     SpO2 10/27/17 1831 97 %     Weight --      Height --      Head Circumference --      Peak Flow --      Pain Score 10/27/17 1831 10     Pain Loc --      Pain Edu? --      Excl. in GC? --     Constitutional: Alert and oriented. Well appearing and in no distress. Head: Normocephalic and atraumatic. Mouth/Throat: Mucous membranes are moist. Missing #30, right lower jaw. Broken #31. No gum swelling or redness noted. Hematological/Lymphatic/Immunological: No cervical lymphadenopathy. Neurologic:  Normal gait without ataxia. Normal speech and language. No gross focal neurologic deficits are appreciated. ____________________________________________  PROCEDURES  .Nerve Block Date/Time: 10/27/2017 8:09 PM Performed by: Racheal Patches, PA-C Authorized by: Lorre Munroe, NP   Consent:    Consent obtained:  Verbal  Consent given by:  Patient   Risks discussed:  Nerve damage   Alternatives discussed:  No treatment Indications:    Indications:  Pain relief Location:    Body area:  Head   Head nerve blocked: inferior alveolar    Laterality:  Right Skin anesthesia (see MAR for exact dosages):    Skin anesthesia method:  None Procedure details (see MAR for exact dosages):    Block needle gauge:  27 G   Anesthetic injected:  Lidocaine 1% WITH epi   Steroid injected:  None   Additive injected:  None   Injection procedure:  Anatomic landmarks identified   Paresthesia:  None Post-procedure details:    Dressing:  None   Outcome:  Pain relieved    Patient tolerance of procedure:  Tolerated well, no immediate complications    ____________________________________________  INITIAL IMPRESSION / ASSESSMENT AND PLAN / ED COURSE  Dental Pain, Broken Tooth:  Dental block performed today Rocephin 1 gm IM today Continue Amoxicillin and Tramadol as prescribed Follow up with oral surgery, call to see if you can have your appt moved up.  FINAL CLINICAL IMPRESSION(S) / ED DIAGNOSES  Final diagnoses:  Pain, dental  Open fracture of tooth, initial encounter      Lorre Munroe, NP 10/27/17 2011    Rockne Menghini, MD 10/27/17 2340

## 2017-11-07 ENCOUNTER — Encounter: Payer: Self-pay | Admitting: *Deleted

## 2017-11-07 ENCOUNTER — Other Ambulatory Visit: Payer: Self-pay

## 2017-11-07 ENCOUNTER — Emergency Department
Admission: EM | Admit: 2017-11-07 | Discharge: 2017-11-08 | Disposition: A | Discharge status: Home / Self Care | Payer: Self-pay | Attending: Emergency Medicine | Admitting: Emergency Medicine

## 2017-11-07 DIAGNOSIS — Z79899 Other long term (current) drug therapy: Secondary | ICD-10-CM | POA: Insufficient documentation

## 2017-11-07 DIAGNOSIS — F17200 Nicotine dependence, unspecified, uncomplicated: Secondary | ICD-10-CM | POA: Insufficient documentation

## 2017-11-07 DIAGNOSIS — K0889 Other specified disorders of teeth and supporting structures: Secondary | ICD-10-CM | POA: Insufficient documentation

## 2017-11-07 MED ORDER — LIDOCAINE-EPINEPHRINE 2 %-1:100000 IJ SOLN
1.7000 mL | Freq: Once | INTRAMUSCULAR | Status: AC
  Administered 2017-11-07: 1.7 mL via INTRADERMAL

## 2017-11-07 NOTE — ED Triage Notes (Signed)
Pt says that he has had right upper dental pain and he had went to get his tooth pulled at the [facility] today and when they went to do it, he thinks they hit a nerve, c/o pain in the right mouth. Took tylenol for pain PTA.

## 2017-11-08 MED ORDER — AMOXICILLIN 500 MG PO CAPS
500.0000 mg | ORAL_CAPSULE | Freq: Once | ORAL | Status: AC
  Administered 2017-11-08: 500 mg via ORAL
  Filled 2017-11-08: qty 1

## 2017-11-08 MED ORDER — AMOXICILLIN 500 MG PO TABS: 500.0000 mg | ORAL_TABLET | Freq: Three times a day (TID) | ORAL | 0 refills | Status: DC

## 2017-11-08 MED ORDER — LIDOCAINE-EPINEPHRINE 2 %-1:100000 IJ SOLN
INTRAMUSCULAR | Status: AC
  Administered 2017-11-07: 1.7 mL via INTRADERMAL
  Filled 2017-11-08: qty 1.7

## 2017-11-08 NOTE — Discharge Instructions (Signed)
Follow up with the dentist as soon as possible. Return to the ER for symptoms that change or worsen if unable to schedule an appointment.

## 2017-11-09 NOTE — ED Provider Notes (Signed)
[facility] Emergency Department Provider Note ____________________________________________  Time seen: Approximately 10:38 PM  I have reviewed the triage vital signs and the nursing notes.   HISTORY  Chief Complaint Dental Pain   HPI Jesse Bullock is a 27 y.o. male who presents to the emergency department for treatment of dental pain.  He states that he had gone to the [facility] dental clinic earlier and they removed part of a tooth in the right upper jaw.  He states that they stopped after hitting a nerve during the numbing process.  He states that the pain shot up around his nose and into his eye and they were unable to complete the entire extraction.  Since that time, he has had severe pain.  Patient requests a dental block.   Past Medical History:  Diagnosis Date  . Allergy   . Bacterial heart infection   . High cholesterol   . Substance abuse (HCC)    prior, but has now detoxed    There are no active problems to display for this patient.   History reviewed. No pertinent surgical history.  Prior to Admission medications   Medication Sig Start Date End Date Taking? Authorizing Provider  amoxicillin (AMOXIL) 500 MG tablet Take 1 tablet (500 mg total) by mouth 3 (three) times daily. 11/08/17   Mikaelyn Arthurs B, FNP  diltiazem (DILTIAZEM CD) 240 MG 24 hr capsule Take 1 capsule (240 mg total) by mouth daily. 04/06/16   Michiel Cowboy A, PA-C    Allergies Ibuprofen; Aspirin; and Haldol [haloperidol lactate]  Family History  Problem Relation Age of Onset  . Heart disease Father   . Heart disease Paternal Grandmother     Social History Social History   Tobacco Use  . Smoking status: Current Every Day Smoker    Packs/day: 0.50  . Smokeless tobacco: Never Used  Substance Use Topics  . Alcohol use: No    Comment: 30+ days  . Drug use: No    Comment: detoxed- in treatment program    Review of Systems Constitutional: Negative for fever or  recent illness. ENT: Positive for dental pain. Musculoskeletal: Negative for trismus of the jaw.  Skin: Negative for wound or lesion. ____________________________________________   PHYSICAL EXAM:  VITAL SIGNS: ED Triage Vitals  Enc Vitals Group     BP 11/07/17 2207 (!) 133/93     Pulse Rate 11/07/17 2207 (!) 116     Resp 11/07/17 2207 16     Temp 11/07/17 2207 98.9 F (37.2 C)     Temp src --      SpO2 11/07/17 2207 98 %     Weight --      Height --      Head Circumference --      Peak Flow --      Pain Score 11/07/17 2159 10     Pain Loc --      Pain Edu? --      Excl. in GC? --     Constitutional: Alert and oriented. Well appearing and in no acute distress. Eyes: Conjunctiva are clear without discharge or drainage. Mouth/Throat: Widespread poor oral hygiene Periodontal Exam    Hematological/Lymphatic/Immunilogical: No palpable adenopathy. Respiratory: Respirations even and unlabored. Musculoskeletal: Full ROM of the jaw. Neurologic: Awake, alert, oriented.  Skin: No facial edema or erythema. Psychiatric: Affect and behavior intact.  ____________________________________________   LABS (all labs ordered are listed, but only abnormal results are displayed)  Labs Reviewed - No data  to display ____________________________________________   RADIOLOGY  Not indicated. ____________________________________________   PROCEDURES  Procedure(s) performed:   .Nerve Block Date/Time: 11/09/2017 5:18 PM Performed by: Chinita Pester, FNP Authorized by: Chinita Pester, FNP   Consent:    Consent obtained:  Verbal   Consent given by:  Patient   Risks discussed:  Nerve damage and unsuccessful block Indications:    Indications:  Pain relief Location:    Nerve block body site: Right upper jaw.   Laterality:  Right Skin anesthesia (see MAR for exact dosages):    Skin anesthesia method:  None Procedure details (see MAR for exact dosages):    Block needle  gauge:  25 G   Anesthetic injected:  Lidocaine 2% WITH epi   Injection procedure:  Anatomic landmarks identified   Paresthesia:  None Post-procedure details:    Outcome:  Anesthesia achieved   Patient tolerance of procedure:  Tolerated well, no immediate complications    Critical Care performed: No ____________________________________________   INITIAL IMPRESSION / ASSESSMENT AND PLAN / ED COURSE  Jesse Bullock is a 27 y.o. male presents to the emergency department for treatment and evaluation of dental pain.  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  Patient was advised to return to the emergency department for symptoms of concern if unable to schedule an appointment with the dentist or oral surgery.  Pertinent labs & imaging results that were available during my care of the patient were reviewed by me and considered in my medical decision making (see chart for details).  ____________________________________________   FINAL CLINICAL IMPRESSION(S) / ED DIAGNOSES  Final diagnoses:  Pain, dental    Discharge Medication List as of 11/08/2017 12:03 AM    START taking these medications   Details  amoxicillin (AMOXIL) 500 MG tablet Take 1 tablet (500 mg total) by mouth 3 (three) times daily., Starting Thu 11/08/2017, Print        If controlled substance prescribed during this visit, 12 month history viewed on the [place] prior to issuing an initial prescription for Schedule II or III opiod.  Note:  This document was prepared using Dragon voice recognition software and may include unintentional dictation errors.    Chinita Pester, FNP 11/09/17 Brandon Melnick, MD 11/09/17 [phone]

## 2017-12-22 ENCOUNTER — Encounter: Payer: Self-pay | Admitting: Emergency Medicine

## 2017-12-22 ENCOUNTER — Emergency Department
Admission: EM | Admit: 2017-12-22 | Discharge: 2017-12-22 | Disposition: A | Discharge status: Home / Self Care | Payer: Self-pay | Attending: Emergency Medicine | Admitting: Emergency Medicine

## 2017-12-22 ENCOUNTER — Other Ambulatory Visit: Payer: Self-pay

## 2017-12-22 DIAGNOSIS — K1120 Sialoadenitis, unspecified: Secondary | ICD-10-CM | POA: Insufficient documentation

## 2017-12-22 DIAGNOSIS — Z79899 Other long term (current) drug therapy: Secondary | ICD-10-CM | POA: Insufficient documentation

## 2017-12-22 DIAGNOSIS — K047 Periapical abscess without sinus: Secondary | ICD-10-CM | POA: Insufficient documentation

## 2017-12-22 DIAGNOSIS — F17200 Nicotine dependence, unspecified, uncomplicated: Secondary | ICD-10-CM | POA: Insufficient documentation

## 2017-12-22 MED ORDER — CLINDAMYCIN HCL 150 MG PO CAPS
300.0000 mg | ORAL_CAPSULE | Freq: Once | ORAL | Status: AC
  Administered 2017-12-22: 300 mg via ORAL
  Filled 2017-12-22: qty 2

## 2017-12-22 MED ORDER — MAGIC MOUTHWASH W/LIDOCAINE: 5.0000 mL | Freq: Four times a day (QID) | ORAL | 0 refills | Status: AC

## 2017-12-22 MED ORDER — AMOXICILLIN-POT CLAVULANATE 875-125 MG PO TABS: 1.0000 | ORAL_TABLET | Freq: Two times a day (BID) | ORAL | 0 refills | Status: DC

## 2017-12-22 MED ORDER — CHLORHEXIDINE GLUCONATE 0.12 % MT SOLN: 10.0000 mL | Freq: Two times a day (BID) | OROMUCOSAL | 0 refills | Status: DC

## 2017-12-22 MED ORDER — CLINDAMYCIN HCL 300 MG PO CAPS: 300.0000 mg | ORAL_CAPSULE | Freq: Four times a day (QID) | ORAL | 0 refills | Status: DC

## 2017-12-22 MED ORDER — AMOXICILLIN-POT CLAVULANATE 875-125 MG PO TABS
1.0000 | ORAL_TABLET | Freq: Once | ORAL | Status: AC
  Administered 2017-12-22: 1 via ORAL
  Filled 2017-12-22: qty 1

## 2017-12-22 NOTE — ED Provider Notes (Signed)
[facility] Emergency Department Provider Note  ____________________________________________  Time seen: Approximately 5:56 PM  I have reviewed the triage vital signs and the nursing notes.   HISTORY  Chief Complaint Dental Pain    HPI Jesse Bullock is a 27 y.o. male who presents the emergency department complaining of right lower dental pain.  Patient reports that he has been seen multiple times in the emergency department for an infected right lower tooth.  Patient reports that he has seen multiple dental clinics, has been seen at [facility] as well.  Patient was in the process of having the tooth extracted with there was a complication during oral surgery.  Patient reports that they stopped the extraction leaving "half the tooth" in the socket.  Patient reports that he is scheduled to go back to [facility] for further extraction under sedation.  He reports swelling, pain to the region.  Patient states that he started antibiotics 2 days ago, but swelling has worsened including an area underneath the right side mandible.  Patient denies any fevers or chills, difficulty breathing or swallowing.  Patient had a history of substance abuse but has gone through detox and is 1 year sober.  Patient requests no controlled substance given his history.    Past Medical History:  Diagnosis Date  . Allergy   . Bacterial heart infection   . High cholesterol   . Substance abuse (HCC)    prior, but has now detoxed    There are no active problems to display for this patient.   History reviewed. No pertinent surgical history.  Prior to Admission medications   Medication Sig Start Date End Date Taking? Authorizing Provider  amoxicillin (AMOXIL) 500 MG tablet Take 1 tablet (500 mg total) by mouth 3 (three) times daily. 11/08/17   Triplett, Rulon Eisenmenger B, FNP  amoxicillin-clavulanate (AUGMENTIN) 875-125 MG tablet Take 1 tablet by mouth 2 (two) times daily. 12/22/17   Cuthriell,  Delorise Royals, PA-C  chlorhexidine (PERIDEX) 0.12 % solution Use as directed 10 mLs in the mouth or throat 2 (two) times daily. Swish and spit 12/22/17   Cuthriell, Delorise Royals, PA-C  clindamycin (CLEOCIN) 300 MG capsule Take 1 capsule (300 mg total) by mouth 4 (four) times daily. 12/22/17   Cuthriell, Delorise Royals, PA-C  diltiazem (DILTIAZEM CD) 240 MG 24 hr capsule Take 1 capsule (240 mg total) by mouth daily. 04/06/16   Michiel Cowboy A, PA-C  magic mouthwash w/lidocaine SOLN Take 5 mLs by mouth 4 (four) times daily. 12/22/17   Cuthriell, Delorise Royals, PA-C    Allergies Ibuprofen; Aspirin; and Haldol [haloperidol lactate]  Family History  Problem Relation Age of Onset  . Heart disease Father   . Heart disease Paternal Grandmother     Social History Social History   Tobacco Use  . Smoking status: Current Every Day Smoker    Packs/day: 0.50  . Smokeless tobacco: Never Used  Substance Use Topics  . Alcohol use: No    Comment: 30+ days  . Drug use: No    Comment: detoxed- in treatment program     Review of Systems  Constitutional: No fever/chills Eyes: No visual changes. No discharge ENT: Right lower dental pain Cardiovascular: no chest pain. Respiratory: no cough. No SOB. Gastrointestinal: No abdominal pain.  No nausea, no vomiting.   Musculoskeletal: Negative for musculoskeletal pain. Skin: Negative for rash, abrasions, lacerations, ecchymosis. Neurological: Negative for headaches, focal weakness or numbness. 10-point ROS otherwise negative.  ____________________________________________  PHYSICAL EXAM:  VITAL SIGNS: ED Triage Vitals [12/22/17 1748]  Enc Vitals Group     BP 118/87     Pulse Rate 89     Resp 16     Temp 98.9 F (37.2 C)     Temp Source Oral     SpO2 97 %     Weight      Height      Head Circumference      Peak Flow      Pain Score 6     Pain Loc      Pain Edu?      Excl. in GC?      Constitutional: Alert and oriented. Well appearing and  in no acute distress. Eyes: Conjunctivae are normal. PERRL. EOMI. Head: Atraumatic. ENT:      Ears:       Nose: No congestion/rhinnorhea.      Mouth/Throat: Mucous membranes are moist.  Visualization of tooth #30 and 31 reveals residual tooth with open socket from previous extraction attempt.  Moderate surrounding erythema and edema.  No fluctuance with compression of tongue or pressure concerning for abscess.  Palpation along the lower mandible reveals edematous lesion in the submandibular region consistent with sialoadenitis.  No other submandibular lymphadenopathy.  No anterior cervical chain lymphadenopathy.  Uvula is midline.  Tonsils are unremarkable bilaterally. Neck: No stridor.   Hematological/Lymphatic/Immunilogical: No cervical lymphadenopathy.  Cardiovascular: Normal rate, regular rhythm. Normal S1 and S2.  Good peripheral circulation. Respiratory: Normal respiratory effort without tachypnea or retractions. Lungs CTAB. Good air entry to the bases with no decreased or absent breath sounds. Musculoskeletal: Full range of motion to all extremities. No gross deformities appreciated. Neurologic:  Normal speech and language. No gross focal neurologic deficits are appreciated.  Skin:  Skin is warm, dry and intact. No rash noted. Psychiatric: Mood and affect are normal. Speech and behavior are normal. Patient exhibits appropriate insight and judgement.   ____________________________________________   LABS (all labs ordered are listed, but only abnormal results are displayed)  Labs Reviewed - No data to display ____________________________________________  EKG   ____________________________________________  RADIOLOGY   No results found.  ____________________________________________    PROCEDURES  Procedure(s) performed:    Procedures    Medications  clindamycin (CLEOCIN) capsule 300 mg (has no administration in time range)  amoxicillin-clavulanate (AUGMENTIN)  875-125 MG per tablet 1 tablet (has no administration in time range)     ____________________________________________   INITIAL IMPRESSION / ASSESSMENT AND PLAN / ED COURSE  Pertinent labs & imaging results that were available during my care of the patient were reviewed by me and considered in my medical decision making (see chart for details).  Review of the [place] CSRS was performed in accordance of the [facility] prior to dispensing any controlled drugs.      Patient's diagnosis is consistent with sial adenitis and dental infection.  Patient presents the emergency department with ongoing dental issues.  Patient has been seen in this department more than once for dental infection.  Patient had an unsuccessful attempt at extraction of tooth #30.  Patient has had ongoing problems as he saves money for complete extraction of the tooth.  Patient is requesting treatment for same.  On exam, patient has findings consistent with dental infection as well as sialoadenitis.  No indication for deep underlying infection such as Ludwig angina.  No indication for labs or imaging at this time.  Patient will be placed on dual therapy as he has been  on multiple antibiotics in the past 2 months for dental infection.  Patient replaced on both clindamycin as well as Augmentin.  Patient is given chlorhexidine mouthwash as well as Magic mouthwash for additional symptom improvement.  Follow-up with dentist when he has enough money for complete extraction..  Patient is given ED precautions to return to the ED for any worsening or new symptoms.     ____________________________________________  FINAL CLINICAL IMPRESSION(S) / ED DIAGNOSES  Final diagnoses:  Dental abscess  Sialoadenitis      NEW MEDICATIONS STARTED DURING THIS VISIT:  ED Discharge Orders         Ordered    amoxicillin-clavulanate (AUGMENTIN) 875-125 MG tablet  2 times daily     12/22/17 1820    clindamycin (CLEOCIN) 300 MG capsule  4 times daily      12/22/17 1820    chlorhexidine (PERIDEX) 0.12 % solution  2 times daily     12/22/17 1820    magic mouthwash w/lidocaine SOLN  4 times daily    Note to Pharmacy:  Dispense in a 1/1/1 ratio. Use lidocaine, diphenhydramine, prednisolone   12/22/17 1820              This chart was dictated using voice recognition software/Dragon. Despite best efforts to proofread, errors can occur which can change the meaning. Any change was purely unintentional.    Racheal Patches, PA-C 12/22/17 1821    Phineas Semen, MD 12/22/17 1910

## 2017-12-22 NOTE — ED Triage Notes (Signed)
Pt to ED via POV c/o dental pain and swelling on the right side of his mouth. Pt has been taken Penicillin for dental infection as well as OTC pain medication without relief. Pt is in NAD at this time.

## 2017-12-23 ENCOUNTER — Emergency Department
Admission: EM | Admit: 2017-12-23 | Discharge: 2017-12-23 | Disposition: A | Discharge status: Home / Self Care | Payer: Self-pay | Attending: Emergency Medicine | Admitting: Emergency Medicine

## 2017-12-23 ENCOUNTER — Other Ambulatory Visit: Payer: Self-pay

## 2017-12-23 ENCOUNTER — Encounter: Payer: Self-pay | Admitting: Emergency Medicine

## 2017-12-23 ENCOUNTER — Emergency Department: Payer: Self-pay

## 2017-12-23 DIAGNOSIS — K047 Periapical abscess without sinus: Secondary | ICD-10-CM | POA: Insufficient documentation

## 2017-12-23 DIAGNOSIS — Z79899 Other long term (current) drug therapy: Secondary | ICD-10-CM | POA: Insufficient documentation

## 2017-12-23 DIAGNOSIS — F17210 Nicotine dependence, cigarettes, uncomplicated: Secondary | ICD-10-CM | POA: Insufficient documentation

## 2017-12-23 LAB — CBC WITH DIFFERENTIAL/PLATELET
ABS IMMATURE GRANULOCYTES: 0.08 10*3/uL — AB (ref 0.00–0.07)
BASOS PCT: 1 %
Basophils Absolute: 0.1 10*3/uL (ref 0.0–0.1)
Eosinophils Absolute: 0.1 10*3/uL (ref 0.0–0.5)
Eosinophils Relative: 1 %
HCT: 37.5 % — ABNORMAL LOW (ref 39.0–52.0)
Hemoglobin: 12.6 g/dL — ABNORMAL LOW (ref 13.0–17.0)
Immature Granulocytes: 1 %
LYMPHS PCT: 26 %
Lymphs Abs: 3.9 10*3/uL (ref 0.7–4.0)
MCH: 31.2 pg (ref 26.0–34.0)
MCHC: 33.6 g/dL (ref 30.0–36.0)
MCV: 92.8 fL (ref 80.0–100.0)
MONOS PCT: 11 %
Monocytes Absolute: 1.7 10*3/uL — ABNORMAL HIGH (ref 0.1–1.0)
Neutro Abs: 9.2 10*3/uL — ABNORMAL HIGH (ref 1.7–7.7)
Neutrophils Relative %: 60 %
PLATELETS: 388 10*3/uL (ref 150–400)
RBC: 4.04 MIL/uL — AB (ref 4.22–5.81)
RDW: 17.2 % — ABNORMAL HIGH (ref 11.5–15.5)
WBC: 15 10*3/uL — AB (ref 4.0–10.5)
nRBC: 0 % (ref 0.0–0.2)

## 2017-12-23 LAB — COMPREHENSIVE METABOLIC PANEL
ALT: 9 U/L (ref 0–44)
ANION GAP: 8 (ref 5–15)
AST: 17 U/L (ref 15–41)
Albumin: 4.5 g/dL (ref 3.5–5.0)
Alkaline Phosphatase: 73 U/L (ref 38–126)
BUN: 14 mg/dL (ref 6–20)
CHLORIDE: 103 mmol/L (ref 98–111)
CO2: 27 mmol/L (ref 22–32)
CREATININE: 0.54 mg/dL — AB (ref 0.61–1.24)
Calcium: 9.2 mg/dL (ref 8.9–10.3)
GFR calc non Af Amer: >60 mL/min (ref >60)
Glucose, Bld: 131 mg/dL — ABNORMAL HIGH (ref 70–99)
POTASSIUM: 4.1 mmol/L (ref 3.5–5.1)
SODIUM: 138 mmol/L (ref 135–145)
Total Bilirubin: 0.3 mg/dL (ref 0.3–1.2)
Total Protein: 7.4 g/dL (ref 6.5–8.1)

## 2017-12-23 MED ORDER — PIPERACILLIN-TAZOBACTAM 3.375 G IVPB 30 MIN
3.3750 g | Freq: Once | INTRAVENOUS | Status: AC
  Administered 2017-12-23: 3.375 g via INTRAVENOUS
  Filled 2017-12-23: qty 50

## 2017-12-23 MED ORDER — IOHEXOL 300 MG/ML  SOLN
75.0000 mL | Freq: Once | INTRAMUSCULAR | Status: AC | PRN
  Administered 2017-12-23: 75 mL via INTRAVENOUS
  Filled 2017-12-23: qty 75

## 2017-12-23 MED ORDER — SODIUM CHLORIDE 0.9 % IV BOLUS
1000.0000 mL | Freq: Once | INTRAVENOUS | Status: AC
  Administered 2017-12-23: 1000 mL via INTRAVENOUS

## 2017-12-23 MED ORDER — PIPERACILLIN-TAZOBACTAM 3.375 G IVPB
INTRAVENOUS | Status: AC
  Administered 2017-12-23: 3.375 g via INTRAVENOUS
  Filled 2017-12-23: qty 50

## 2017-12-23 MED ORDER — LIDOCAINE-EPINEPHRINE 2 %-1:100000 IJ SOLN
1.7000 mL | Freq: Once | INTRAMUSCULAR | Status: DC
  Filled 2017-12-23: qty 1.7

## 2017-12-23 NOTE — Discharge Instructions (Addendum)
OPTIONS FOR DENTAL FOLLOW UP CARE ° °[place] Department of Health and Human Services - Local Safety Net Dental Clinics °http://www.ncdhhs.gov/dph/oralhealth/services/safetynetclinics.htm °  °[facility] (336-562-3123) ° °[facility] [place] ([phone]) ° °[facility] ([phone] ext [phone]) ° °[facility] ([phone]) ° °[facility] ([phone]) °This clinic caters to the indigent population and is on a lottery system. °Location: °[facility], [address], [place] °Clinic Hours: °Wednesdays from 6pm - 9pm, patients seen by a lottery system. °For dates, call or go to [email or web address]/[facility]/[email or web address] °Services: °Cleanings, fillings and simple extractions. °Payment Options: °DENTAL WORK IS FREE OF CHARGE. Bring proof of income or support. °Best way to get seen: °Arrive at 5:15 pm - this is a lottery, NOT first come/first serve, so arriving earlier will not increase your chances of being seen. °  °  °[facility] °[phone] °Select option 1 for emergencies °  °Location: °[facility], [address], [place] °Clinic Hours: °No walk-ins accepted - call the day before to schedule an appointment. °Check in times are 9:30 am and 1:30 pm. °Services: °Simple extractions, temporary fillings, pulpectomy/pulp debridement, uncomplicated abscess drainage. °Payment Options: °PAYMENT IS DUE AT THE TIME OF SERVICE.  Fee is usually $100-200, additional surgical procedures (e.g. abscess drainage) may be extra. °Cash, checks, Visa/MasterCard accepted.  Can file Medicaid if patient is covered for dental - patient should call case worker to check. °No discount for [facility] patients. °Best way to get seen: °MUST call the day before and get onto the schedule. Can usually be seen the next 1-2 days. No walk-ins accepted. °  °  °[facility] °[phone] °   °Location: °[facility], [address], [place] °Clinic Hours: °M, W, Th, F 8am or 1:30pm, Tues 9a or 1:30 - first come/first served. °Services: °Simple extractions, temporary fillings, uncomplicated abscess drainage.  You do not need to be an [place] resident. °Payment Options: °PAYMENT IS DUE AT THE TIME OF SERVICE. °Dental insurance, otherwise sliding scale - bring proof of income or support. °Depending on income and treatment needed, cost is usually $50-200. °Best way to get seen: °Arrive early as it is first come/first served. °  °  °[facility] °[phone] °  °Location: °[address] °Clinic Hours: °Mon-Thu 8a-5p °Services: °Most basic dental services including extractions and fillings. °Payment Options: °PAYMENT IS DUE AT THE TIME OF SERVICE. °Sliding scale, up to 50% off - bring proof if income or support. °Medicaid with dental option accepted. °Best way to get seen: °Call to schedule an appointment, can usually be seen within 2 weeks OR they will try to see walk-ins - show up at 8a or 2p (you may have to wait). °  °  °[facility] °[phone] °[place] RESIDENTS ONLY °  °Location: °[facility], [address], [place],  [address] °Clinic Hours: By appointment only. °Monday - Thursday 8am-5pm, Friday 8am-12pm °Services: Cleanings, fillings, extractions. °Payment Options: °PAYMENT IS DUE AT THE TIME OF SERVICE. °Cash, Visa or MasterCard. Sliding scale - $30 minimum per service. °Best way to get seen: °Come in to office, complete packet and make an appointment - need proof of income °or support monies for each household member and proof of [place] residence. °Usually takes about a month to get in. °  °  °[facility] °[phone] °  °Location: °[address]   [place] °Clinic Hours: Walk-in Urgent Care Dental Services are offered Monday-Friday  mornings only. °The numbers of emergencies accepted daily is limited to the number of °providers available. °Maximum 15 - Mondays, Wednesdays & Thursdays °Maximum 10 - Tuesdays & Fridays °Services: °You do not need to be a [place] resident to be seen for a dental emergency. °Emergencies are defined as pain, swelling, abnormal bleeding, or dental trauma. Walkins will receive x-rays if needed. °NOTE: Dental cleaning is not an emergency. °Payment Options: °PAYMENT IS DUE AT THE TIME OF SERVICE. °Minimum co-pay is $40.00 for uninsured patients. °Minimum co-pay is $3.00 for Medicaid with dental coverage. °Dental Insurance is accepted and must be presented at time of visit. °Medicare does not cover dental. °Forms of payment: Cash, credit card, checks. °Best way to get seen: °If not previously registered with the clinic, walk-in dental registration begins at 7:15 am and is on a first come/first serve basis. °If previously registered with the clinic, call to make an appointment. °  °  °[facility] °[phone] °[place] RESIDENTS ONLY °  °Location: °[address], [place], [place] °Clinic Hours: °Mon-Thu 10a-2p °Services: Extractions only! °Payment Options: °FREE (donations accepted) - bring proof of income or support °Best way to get seen: °Call and schedule an appointment OR come at 8am on the 1st Monday of every month (except for holidays) when it is first come/first served. °  °  °[facility] °[phone] °  °Location: °[address], [place] °Clinic Hours: °Friday mornings °Services, Payment Options, Best way to get seen: °Call for info °

## 2017-12-23 NOTE — ED Provider Notes (Signed)
[facility] Emergency Department Provider Note  ____________________________________________   None    (approximate)  I have reviewed the triage vital signs and the nursing notes.   HISTORY  Chief Complaint Dental Pain    HPI Jesse Bullock is a 27 y.o. male presents to the emergency department complaining of right-sided jaw pain.  He is been placed on 2 antibiotics when he was seen here yesterday.  He states he was already on antibiotic prior to coming here.  He states that his teeth have been bad and wanted the molars is broken at the gumline.  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  He does not want pain medications.  He wants to address the infection so that the pain will go away.  He denies any fever or chills.  States he is having trouble opening and closing his jaw.  States pain radiates into the neck.    Past Medical History:  Diagnosis Date  . Allergy   . Bacterial heart infection   . High cholesterol   . Substance abuse (HCC)    prior, but has now detoxed    There are no active problems to display for this patient.   History reviewed. No pertinent surgical history.  Prior to Admission medications   Medication Sig Start Date End Date Taking? Authorizing Provider  amoxicillin (AMOXIL) 500 MG tablet Take 1 tablet (500 mg total) by mouth 3 (three) times daily. 11/08/17   Triplett, Rulon Eisenmenger B, FNP  amoxicillin-clavulanate (AUGMENTIN) 875-125 MG tablet Take 1 tablet by mouth 2 (two) times daily. 12/22/17   Cuthriell, Delorise Royals, PA-C  chlorhexidine (PERIDEX) 0.12 % solution Use as directed 10 mLs in the mouth or throat 2 (two) times daily. Swish and spit 12/22/17   Cuthriell, Delorise Royals, PA-C  clindamycin (CLEOCIN) 300 MG capsule Take 1 capsule (300 mg total) by mouth 4 (four) times daily. 12/22/17   Cuthriell, Delorise Royals, PA-C  diltiazem (DILTIAZEM CD) 240 MG 24 hr  capsule Take 1 capsule (240 mg total) by mouth daily. 04/06/16   Michiel Cowboy A, PA-C  magic mouthwash w/lidocaine SOLN Take 5 mLs by mouth 4 (four) times daily. 12/22/17   Cuthriell, Delorise Royals, PA-C    Allergies Ibuprofen; Aspirin; and Haldol [haloperidol lactate]  Family History  Problem Relation Age of Onset  . Heart disease Father   . Heart disease Paternal Grandmother     Social History Social History   Tobacco Use  . Smoking status: Current Every Day Smoker    Packs/day: 0.50  . Smokeless tobacco: Never Used  Substance Use Topics  . Alcohol use: No    Comment: 30+ days  . Drug use: No    Comment: detoxed- in treatment program    Review of Systems  Constitutional: No fever/chills Eyes: No visual changes. ENT: No sore throat.  Positive for dental pain and swelling into the right jaw in the right side of the neck. Respiratory: Denies cough Genitourinary: Negative for dysuria. Musculoskeletal: Negative for back pain. Skin: Negative for rash.    ____________________________________________   PHYSICAL EXAM:  VITAL SIGNS: ED Triage Vitals  Enc Vitals Group     BP 12/23/17 1416 (!) 144/84     Pulse Rate 12/23/17 1416 99     Resp 12/23/17 1416 18     Temp 12/23/17 1416 98.2 F (36.8 C)     Temp Source  12/23/17 1416 Oral     SpO2 12/23/17 1416 96 %     Weight 12/23/17 1416 120 lb (54.4 kg)     Height 12/23/17 1416 5\' 6"  (1.676 m)     Head Circumference --      Peak Flow --      Pain Score 12/23/17 1420 10     Pain Loc --      Pain Edu? --      Excl. in GC? --     Constitutional: Alert and oriented. Well appearing and in no acute distress. Eyes: Conjunctivae are normal.  Head: Atraumatic. Nose: No congestion/rhinnorhea. Mouth/Throat: Mucous membranes are moist.  Poor dentition is noted throughout.  Several molars are decayed at the gumline.  There is some swelling at the gumline on the right lower side.  The tenderness extends from the mandible into  the upper portion of the tonsillar gland.  Patient is spitting and drooling some. Neck:  supple no lymphadenopathy noted Cardiovascular: Normal rate, regular rhythm. Heart sounds are normal Respiratory: Normal respiratory effort.  No retractions, lungs c t a  GU: deferred Musculoskeletal: FROM all extremities, warm and well perfused Neurologic:  Normal speech and language.  Skin:  Skin is warm, dry and intact. No rash noted. Psychiatric: Mood and affect are normal. Speech and behavior are normal.  ____________________________________________   LABS (all labs ordered are listed, but only abnormal results are displayed)  Labs Reviewed  CBC WITH DIFFERENTIAL/PLATELET - Abnormal; Notable for the following components:      Result Value   WBC 15.0 (*)    RBC 4.04 (*)    Hemoglobin 12.6 (*)    HCT 37.5 (*)    RDW 17.2 (*)    Neutro Abs 9.2 (*)    Monocytes Absolute 1.7 (*)    Abs Immature Granulocytes 0.08 (*)    All other components within normal limits  COMPREHENSIVE METABOLIC PANEL - Abnormal; Notable for the following components:   Glucose, Bld 131 (*)    Creatinine, Ser 0.54 (*)    All other components within normal limits   ____________________________________________   ____________________________________________  RADIOLOGY  CT of the neck soft tissue shows a dental abscess along with some swelling of the floor of the mouth.  ____________________________________________   PROCEDURES  Procedure(s) performed:   Dental Block Date/Time: 12/23/2017 4:44 PM Performed by: Faythe Ghee, PA-C Authorized by: Faythe Ghee, PA-C   Consent:    Consent obtained:  Verbal   Consent given by:  Patient   Risks discussed:  Allergic reaction, infection, nerve damage, swelling, unsuccessful block and pain   Alternatives discussed:  No treatment Indications:    Indications: dental abscess and dental pain   Procedure details (see MAR for exact dosages):    Needle gauge:  24  G   Anesthetic injected:  Lidocaine 2% WITH epi Post-procedure details:    Outcome:  Anesthesia achieved   Patient tolerance of procedure:  Tolerated well, no immediate complications    The injection was inserted into the gumline of the right lower molars.  ____________________________________________   INITIAL IMPRESSION / ASSESSMENT AND PLAN / ED COURSE  Pertinent labs & imaging results that were available during my care of the patient were reviewed by me and considered in my medical decision making (see chart for details).   Patient is a 27 year old male presents emergency department complaint of dental pain and swelling.  He denies fever or chills.  He has been seen here on  12/22/2017, 11/07/2017, 10/27/2017, and 10/18/2017 along with 10/02/2017 for dental pain..   On physical exam the patient is producing quite a bit of saliva.  The area was suctioned out.  The right lower gumline is swollen and tender.  The tooth on the right lower molar is broken and decayed at the gumline.  The tonsillar glands are slightly tender.  Explained to the patient that this is due to his chronic dental problems.  He was given a dental block into the right lower molar area.  Labs for CBC show an elevated WBC of 15, conference metabolic panel is basically normal.  CT soft tissue of the neck with contrast is ordered.  CT shows a dental abscess along with some swelling at the floor of the mouth.  The CBC is elevated at 15, conference metabolic panel is basically normal.  Explained to the patient that he may need to be admitted due to the multiple days of antibiotics without relief of the abscess.  Patient is refusing to stay at this time he states that he will continue take the oral antibiotics and return if worsening.  I once again encouraged him to stay but he states he does not.  He was discharged in stable condition.  As part of my medical decision making, I reviewed the following data within the  electronic MEDICAL RECORD NUMBER History obtained from family, Nursing notes reviewed and incorporated, Labs reviewed CBC with elevated WBC of 15, conference metabolic panel is normal, Old chart reviewed, Radiograph reviewed CT soft tissue of the neck shows a dental abscess along with swelling of the floor the mouth, Notes from prior ED visits and [place] Controlled Substance Database  ____________________________________________   FINAL CLINICAL IMPRESSION(S) / ED DIAGNOSES  Final diagnoses:  Dental abscess      NEW MEDICATIONS STARTED DURING THIS VISIT:  Discharge Medication List as of 12/23/2017  5:44 PM       Note:  This document was prepared using Dragon voice recognition software and may include unintentional dictation errors.    Faythe GheeFisher, Shanetta Nicolls W, PA-C 12/23/17 1924    Phineas SemenGoodman, Graydon, MD 12/23/17 [id number]

## 2017-12-23 NOTE — ED Triage Notes (Signed)
Seen here yesterday for dental pain and sent home with abx X 2.  Reports severe pain today. Trismus noted. Spitting secretions. Mild swelling to right jaw. Difficult to assess mouth as patient not opening far enough to visualize.  Difficult to assess if pt not swallowing secretions because of pain.  Discussed patient with dr Cyril Loosenkinner as patient was seen here yesterday.  Will start patient in flex per dr Cyril Loosenkinner.

## 2017-12-25 ENCOUNTER — Other Ambulatory Visit: Payer: Self-pay

## 2017-12-25 DIAGNOSIS — Z79899 Other long term (current) drug therapy: Secondary | ICD-10-CM | POA: Insufficient documentation

## 2017-12-25 DIAGNOSIS — K047 Periapical abscess without sinus: Secondary | ICD-10-CM | POA: Insufficient documentation

## 2017-12-25 DIAGNOSIS — F17210 Nicotine dependence, cigarettes, uncomplicated: Secondary | ICD-10-CM | POA: Insufficient documentation
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
## 2017-12-26 ENCOUNTER — Emergency Department
Admission: EM | Admit: 2017-12-26 | Discharge: 2017-12-26 | Disposition: A | Discharge status: Home / Self Care | Payer: Self-pay | Attending: Emergency Medicine | Admitting: Emergency Medicine

## 2017-12-26 ENCOUNTER — Emergency Department: Payer: Self-pay

## 2017-12-26 DIAGNOSIS — K047 Periapical abscess without sinus: Secondary | ICD-10-CM

## 2017-12-26 LAB — COMPREHENSIVE METABOLIC PANEL
ALT: 16 U/L (ref 0–44)
AST: 20 U/L (ref 15–41)
Albumin: 4.9 g/dL (ref 3.5–5.0)
Alkaline Phosphatase: 79 U/L (ref 38–126)
Anion gap: 9 (ref 5–15)
BUN: 13 mg/dL (ref 6–20)
CO2: 26 mmol/L (ref 22–32)
Calcium: 9.6 mg/dL (ref 8.9–10.3)
Chloride: 102 mmol/L (ref 98–111)
Creatinine, Ser: 0.65 mg/dL (ref 0.61–1.24)
GFR calc Af Amer: >60 mL/min (ref >60)
GFR calc non Af Amer: >60 mL/min (ref >60)
Glucose, Bld: 101 mg/dL — ABNORMAL HIGH (ref 70–99)
POTASSIUM: 4.1 mmol/L (ref 3.5–5.1)
Sodium: 137 mmol/L (ref 135–145)
TOTAL PROTEIN: 8.5 g/dL — AB (ref 6.5–8.1)
Total Bilirubin: 0.3 mg/dL (ref 0.3–1.2)

## 2017-12-26 LAB — CBC WITH DIFFERENTIAL/PLATELET
Abs Immature Granulocytes: 0.04 10*3/uL (ref 0.00–0.07)
BASOS PCT: 1 %
Basophils Absolute: 0.1 10*3/uL (ref 0.0–0.1)
Eosinophils Absolute: 0.2 10*3/uL (ref 0.0–0.5)
Eosinophils Relative: 1 %
HCT: 40.3 % (ref 39.0–52.0)
Hemoglobin: 13 g/dL (ref 13.0–17.0)
Immature Granulocytes: 0 %
Lymphocytes Relative: 35 %
Lymphs Abs: 5 10*3/uL — ABNORMAL HIGH (ref 0.7–4.0)
MCH: 30.4 pg (ref 26.0–34.0)
MCHC: 32.3 g/dL (ref 30.0–36.0)
MCV: 94.4 fL (ref 80.0–100.0)
Monocytes Absolute: 1.5 10*3/uL — ABNORMAL HIGH (ref 0.1–1.0)
Monocytes Relative: 10 %
NEUTROS ABS: 7.6 10*3/uL (ref 1.7–7.7)
Neutrophils Relative %: 53 %
Platelets: 473 10*3/uL — ABNORMAL HIGH (ref 150–400)
RBC: 4.27 MIL/uL (ref 4.22–5.81)
RDW: 17.2 % — ABNORMAL HIGH (ref 11.5–15.5)
WBC: 14.4 10*3/uL — ABNORMAL HIGH (ref 4.0–10.5)
nRBC: 0 % (ref 0.0–0.2)

## 2017-12-26 MED ORDER — SODIUM CHLORIDE 0.9 % IV BOLUS
1000.0000 mL | Freq: Once | INTRAVENOUS | Status: AC
  Administered 2017-12-26: 1000 mL via INTRAVENOUS

## 2017-12-26 MED ORDER — HYDROCODONE-ACETAMINOPHEN 5-325 MG PO TABS: 1.0000 | ORAL_TABLET | Freq: Four times a day (QID) | ORAL | 0 refills | Status: DC | PRN

## 2017-12-26 MED ORDER — CHLORHEXIDINE GLUCONATE 0.12 % MT SOLN: 15.0000 mL | Freq: Two times a day (BID) | OROMUCOSAL | 0 refills | Status: DC

## 2017-12-26 MED ORDER — PROPOFOL 10 MG/ML IV BOLUS
100.0000 mg | Freq: Once | INTRAVENOUS | Status: AC
  Administered 2017-12-26: 100 mg via INTRAVENOUS

## 2017-12-26 MED ORDER — CHLORHEXIDINE GLUCONATE 0.12 % MT SOLN: 15.0000 mL | Freq: Once | OROMUCOSAL | Status: DC

## 2017-12-26 MED ORDER — BUPIVACAINE HCL (PF) 0.5 % IJ SOLN
30.0000 mL | Freq: Once | INTRAMUSCULAR | Status: AC
  Administered 2017-12-26: 30 mL
  Filled 2017-12-26: qty 30

## 2017-12-26 MED ORDER — CHLORHEXIDINE GLUCONATE 0.12 % MT SOLN: 15.0000 mL | Freq: Two times a day (BID) | OROMUCOSAL | 0 refills | Status: AC

## 2017-12-26 MED ORDER — TRAMADOL HCL 50 MG PO TABS: 50.0000 mg | ORAL_TABLET | Freq: Four times a day (QID) | ORAL | 0 refills | Status: AC | PRN

## 2017-12-26 MED ORDER — IOPAMIDOL (ISOVUE-300) INJECTION 61%
75.0000 mL | Freq: Once | INTRAVENOUS | Status: AC | PRN
  Administered 2017-12-26: 75 mL via INTRAVENOUS

## 2017-12-26 MED ORDER — ACETAMINOPHEN 10 MG/ML IV SOLN
1000.0000 mg | Freq: Once | INTRAVENOUS | Status: AC
  Administered 2017-12-26: 1000 mg via INTRAVENOUS
  Filled 2017-12-26: qty 100

## 2017-12-26 MED ORDER — PROCHLORPERAZINE EDISYLATE 10 MG/2ML IJ SOLN
10.0000 mg | Freq: Once | INTRAMUSCULAR | Status: AC
  Administered 2017-12-26: 10 mg via INTRAVENOUS
  Filled 2017-12-26: qty 2

## 2017-12-26 MED ORDER — PROPOFOL 10 MG/ML IV BOLUS
200.0000 mg | Freq: Once | INTRAVENOUS | Status: DC
  Filled 2017-12-26: qty 20

## 2017-12-26 NOTE — ED Notes (Signed)
Pt back in room.

## 2017-12-26 NOTE — ED Notes (Signed)
Pt asked to go back to lobby in order to get wife to make sure she didn't "drive away with my car."

## 2017-12-26 NOTE — ED Provider Notes (Signed)
[facility] Emergency Department Provider Note  ____________________________________________   First MD Initiated Contact with Patient 12/26/17 0037     (approximate)  I have reviewed the triage vital signs and the nursing notes.   HISTORY  Chief Complaint Abscess    HPI Jesse Bullock is a 27 y.o. male who comes to the emergency department with worsening right-sided dental pain.  He has been seen in the emergency department twice recently and is currently taking both clindamycin and Augmentin as well as using Peridex and he feels like his symptoms are worsening.  Denies shortness of breath.  Is able to eat and drink.  Pain is severe right sided nonradiating.  He has not seen a dentist because he cannot afford it.  No fevers or chills.  He comes to the emergency department today requesting removal of his cracked tooth.  No history of diabetes mellitus.  No change in his voice.   Past Medical History:  Diagnosis Date  . Allergy   . Bacterial heart infection   . High cholesterol   . Substance abuse (HCC)    prior, but has now detoxed    There are no active problems to display for this patient.   No past surgical history on file.  Prior to Admission medications   Medication Sig Start Date End Date Taking? Authorizing Provider  amoxicillin-clavulanate (AUGMENTIN) 875-125 MG tablet Take 1 tablet by mouth 2 (two) times daily. 12/22/17  Yes Cuthriell, Delorise Royals, PA-C  buprenorphine-naloxone (SUBOXONE) 8-2 mg SUBL SL tablet Place 1 tablet under the tongue 3 (three) times daily. 12/06/17  Yes [provider]  gabapentin (NEURONTIN) 300 MG capsule Take 300 mg by mouth 3 (three) times daily. 12/06/17  Yes [provider]  ondansetron (ZOFRAN-ODT) 4 MG disintegrating tablet Take 4 mg by mouth every 4 (four) hours as needed for nausea. 12/06/17  Yes [provider]  amoxicillin (AMOXIL) 500 MG tablet Take 1 tablet (500 mg total)  by mouth 3 (three) times daily. Patient not taking: Reported on 12/26/2017 11/08/17   Kem Boroughs B, FNP  chlorhexidine (PERIDEX) 0.12 % solution Use as directed 10 mLs in the mouth or throat 2 (two) times daily. Swish and spit 12/22/17   Cuthriell, Delorise Royals, PA-C  chlorhexidine (PERIDEX) 0.12 % solution Use as directed 15 mLs in the mouth or throat 2 (two) times daily. 12/26/17   Merrily Brittle, MD  clindamycin (CLEOCIN) 300 MG capsule Take 1 capsule (300 mg total) by mouth 4 (four) times daily. 12/22/17   Cuthriell, Delorise Royals, PA-C  diltiazem (DILTIAZEM CD) 240 MG 24 hr capsule Take 1 capsule (240 mg total) by mouth daily. 04/06/16   Michiel Cowboy A, PA-C  magic mouthwash w/lidocaine SOLN Take 5 mLs by mouth 4 (four) times daily. 12/22/17   Cuthriell, Delorise Royals, PA-C  traMADol (ULTRAM) 50 MG tablet Take 1 tablet (50 mg total) by mouth every 6 (six) hours as needed. 12/26/17 12/26/18  Merrily Brittle, MD    Allergies Ibuprofen; Aspirin; and Haldol [haloperidol lactate]  Family History  Problem Relation Age of Onset  . Heart disease Father   . Heart disease Paternal Grandmother     Social History Social History   Tobacco Use  . Smoking status: Current Every Day Smoker    Packs/day: 0.50  . Smokeless tobacco: Never Used  Substance Use Topics  . Alcohol use: No    Comment: 30+ days  . Drug use: No    Comment: detoxed-  in treatment program    Review of Systems Constitutional: No fever/chills ENT: Positive for dental pain Cardiovascular: Denies chest pain. Respiratory: Denies shortness of breath. Gastrointestinal: No abdominal pain.  No nausea, no vomiting.   Musculoskeletal: Negative for back pain. Neurological: Negative for headaches   ____________________________________________   PHYSICAL EXAM:  VITAL SIGNS: ED Triage Vitals  Enc Vitals Group     BP 12/25/17 2249 (!) 141/78     Pulse Rate 12/25/17 2249 92     Resp 12/25/17 2249 20     Temp 12/25/17 2249  98.7 F (37.1 C)     Temp Source 12/25/17 2249 Oral     SpO2 12/25/17 2249 98 %     Weight 12/25/17 2248 120 lb (54.4 kg)     Height 12/25/17 2248 5\' 6"  (1.676 m)     Head Circumference --      Peak Flow --      Pain Score 12/25/17 2248 7     Pain Loc --      Pain Edu? --      Excl. in GC? --     Constitutional: Alert and oriented x4 nontoxic no diaphoresis appears quite uncomfortable speaks in full clear sentences Head: Atraumatic. Nose: No congestion/rhinnorhea. Mouth/Throat: No trismus uvula midline no pharyngeal erythema or exudate.  Some tenderness on the right lower mouth although no induration or fullness.  No submental or sublingual induration or fluctuance.  He does have multiple cracked teeth Neck: No stridor.  Some right sided anterior lymphadenopathy Cardiovascular: Tachycardic regular rhythm Respiratory: Normal respiratory effort.  No retractions. Gastrointestinal: Soft nontender Neurologic:  Normal speech and language. No gross focal neurologic deficits are appreciated.  Skin:  Skin is warm, dry and intact. No rash noted.    ____________________________________________  LABS (all labs ordered are listed, but only abnormal results are displayed)  Labs Reviewed  COMPREHENSIVE METABOLIC PANEL - Abnormal; Notable for the following components:      Result Value   Glucose, Bld 101 (*)    Total Protein 8.5 (*)    All other components within normal limits  CBC WITH DIFFERENTIAL/PLATELET - Abnormal; Notable for the following components:   WBC 14.4 (*)    RDW 17.2 (*)    Platelets 473 (*)    Lymphs Abs 5.0 (*)    Monocytes Absolute 1.5 (*)    All other components within normal limits    Lab work reviewed by me with elevated white count although improving __________________________________________  EKG   ____________________________________________  RADIOLOGY  CT of the neck reviewed by me with slightly worsening  abscess ____________________________________________   DIFFERENTIAL includes but not limited to  Dental abscess, Ludwig's angina, submandibular abscess, dental infection   PROCEDURES  Procedure(s) performed: Yes  .Sedation Date/Time: 12/26/2017 2:14 AM Performed by: Merrily Brittleifenbark, Leovanni Bjorkman, MD Authorized by: Merrily Brittleifenbark, Mckay Brandt, MD   Consent:    Consent obtained:  Verbal   Consent given by:  Patient   Risks discussed:  Allergic reaction, dysrhythmia, inadequate sedation, nausea, prolonged hypoxia resulting in organ damage, prolonged sedation necessitating reversal, respiratory compromise necessitating ventilatory assistance and intubation and vomiting   Alternatives discussed:  Analgesia without sedation, anxiolysis and regional anesthesia Universal protocol:    Procedure explained and questions answered to patient or proxy's satisfaction: yes     Relevant documents present and verified: yes     Test results available and properly labeled: yes     Imaging studies available: yes     Required blood products, implants,  devices, and special equipment available: yes     Site/side marked: yes     Immediately prior to procedure a time out was called: yes     Patient identity confirmation method:  Verbally with patient Indications:    Procedure necessitating sedation performed by:  Physician performing sedation Pre-sedation assessment:    Time since last food or drink:  8 hours   ASA classification: class 1 - normal, healthy patient     Neck mobility: normal     Mouth opening:  3 or more finger widths   Thyromental distance:  4 finger widths   Mallampati score:  I - soft palate, uvula, fauces, pillars visible   Pre-sedation assessments completed and reviewed: airway patency, cardiovascular function, hydration status, mental status, nausea/vomiting, pain level, respiratory function and temperature     Pre-sedation assessment completed:  12/26/2017 2:14 AM Immediate pre-procedure details:     Reassessment: Patient reassessed immediately prior to procedure     Reviewed: vital signs, relevant labs/tests and NPO status     Verified: bag valve mask available, emergency equipment available, intubation equipment available, IV patency confirmed, oxygen available and suction available   Procedure details (see MAR for exact dosages):    Preoxygenation:  Nasal cannula   Sedation:  Propofol   Analgesia:  None   Intra-procedure monitoring:  Blood pressure monitoring, cardiac monitor, continuous pulse oximetry, frequent LOC assessments, frequent vital sign checks and continuous capnometry   Intra-procedure events: none     Total Provider sedation time (minutes):  17 Post-procedure details:    Post-sedation assessment completed:  12/26/2017 3:19 AM   Attendance: Constant attendance by certified staff until patient recovered     Recovery: Patient returned to pre-procedure baseline     Post-sedation assessments completed and reviewed: airway patency, cardiovascular function, hydration status, mental status, nausea/vomiting, pain level, respiratory function and temperature     Patient is stable for discharge or admission: yes     Patient tolerance:  Tolerated well, no immediate complications .Marland KitchenIncision and Drainage Date/Time: 12/26/2017 3:19 AM Performed by: Merrily Brittle, MD Authorized by: Merrily Brittle, MD   Consent:    Consent obtained:  Verbal and written   Consent given by:  Patient   Risks discussed:  Bleeding, incomplete drainage and pain   Alternatives discussed:  Alternative treatment Location:    Type:  Abscess   Size:  2cm   Location:  Mouth   Mouth location:  Floor of mouth Sedation:    Sedation type:  Deep Anesthesia (see MAR for exact dosages):    Anesthesia method:  Local infiltration   Local anesthetic:  Bupivacaine 0.5% w/o epi Procedure type:    Complexity:  Complex Procedure details:    Needle aspiration: no     Incision types:  Stab incision   Scalpel blade:   11   Wound management:  Probed and deloculated and irrigated with saline   Drainage:  Bloody and purulent   Drainage amount:  Moderate   Wound treatment:  Wound left open   Packing materials:  None Post-procedure details:    Patient tolerance of procedure:  Tolerated with difficulty .Nerve Block Date/Time: 12/26/2017 3:40 AM Performed by: Merrily Brittle, MD Authorized by: Merrily Brittle, MD   Consent:    Consent obtained:  Verbal   Consent given by:  Patient   Risks discussed:  Intravenous injection, nerve damage, pain and unsuccessful block Indications:    Indications:  Procedural anesthesia and pain relief Location:    Body area:  Head   Head nerve blocked: right infraalveolar nerve. Skin anesthesia (see MAR for exact dosages):    Skin anesthesia method:  None Procedure details (see MAR for exact dosages):    Block needle gauge:  25 G   Anesthetic injected:  Bupivacaine 0.5% w/o epi   Injection procedure:  Anatomic landmarks identified, anatomic landmarks palpated, incremental injection and negative aspiration for blood Post-procedure details:    Outcome:  Pain unchanged   Patient tolerance of procedure:  Tolerated well, no immediate complications   Critical Care performed: no  ____________________________________________   INITIAL IMPRESSION / ASSESSMENT AND PLAN / ED COURSE  Pertinent labs & imaging results that were available during my care of the patient were reviewed by me and considered in my medical decision making (see chart for details).   As part of my medical decision making, I reviewed the following data within the electronic MEDICAL RECORD NUMBER History obtained from family if available, nursing notes, old chart and ekg, as well as notes from prior ED visits.  Patient comes to the emergency department uncomfortable appearing although with no signs of posterior infection.  He has not had his abscess drained and is compliant with antibiotics per report.  On exam  he has no signs of posterior involvement.  I attempted a nerve block without improvement in his symptoms.  I recommended incision and drainage the emergency department and he is amenable however given the previous CT scan we will get another one to evaluate for possible deeper abscess that would not be amenable to ER drainage.  The patient's CT is only minimally changed from previous with no posterior involvement.  Given his pain and inability to take opioids secondary to drug rehabilitation decision was made for propofol sedation.  Patient tolerated the sedation well and I was able to perform a stab incision and break-up the loculations with Kellys expressing a moderate amount of purulent material.  He then washed out his mouth and feels improved.  Given Peridex once again and I will prescribe tramadol as he reports that the only opioid he is allowed to have in rehab.  He will follow-up with the dentist within the next 2 days.  Dental resource given.      ____________________________________________   FINAL CLINICAL IMPRESSION(S) / ED DIAGNOSES  Final diagnoses:  Dental abscess      NEW MEDICATIONS STARTED DURING THIS VISIT:  New Prescriptions   CHLORHEXIDINE (PERIDEX) 0.12 % SOLUTION    Use as directed 15 mLs in the mouth or throat 2 (two) times daily.   TRAMADOL (ULTRAM) 50 MG TABLET    Take 1 tablet (50 mg total) by mouth every 6 (six) hours as needed.     Note:  This document was prepared using Dragon voice recognition software and may include unintentional dictation errors.      Merrily Brittle, MD 12/26/17 [phone]

## 2017-12-26 NOTE — ED Notes (Signed)
Esignature not working so patient unable to sign. Patient understood discharge teaching and paperwork and follow up information. Patient understood that he needed to make sure he went and saw a dentist as soon as possible. Patient seemed frustrated and upset when leaving. Patient wife understood all above information and seemed okay with going to dentist asap.

## 2017-12-26 NOTE — Discharge Instructions (Addendum)
Fortunately today we were able to get your abscess open and remove a large amount of the pus, but it is still CRITICALLY IMPORTANT that you follow up with a dentist within 2 days for a recheck.  Not all ERs have dentists available, and it is important you see either a dentist or an oral surgeon and not just an ER doctor.  Continue your augmentin/clindamycin as prescribed and use your peridex rinses three times a day to help keep your mouth clean.  Return to the ED for any concerns.  It was a pleasure to take care of you today, and thank you for coming to our emergency department.  If you have any questions or concerns before leaving please ask the nurse to grab me and I'm more than happy to go through your aftercare instructions again.  If you were prescribed any opioid pain medication today such as Norco, Vicodin, Percocet, morphine, hydrocodone, or oxycodone please make sure you do not drive when you are taking this medication as it can alter your ability to drive safely.  If you have any concerns once you are home that you are not improving or are in fact getting worse before you can make it to your follow-up appointment, please do not hesitate to call 911 and come back for further evaluation.  Merrily BrittleNeil Rudransh Bellanca, MD  Results for orders placed or performed during the hospital encounter of 12/26/17  Comprehensive metabolic panel  Result Value Ref Range   Sodium 137 135 - 145 mmol/L   Potassium 4.1 3.5 - 5.1 mmol/L   Chloride 102 98 - 111 mmol/L   CO2 26 22 - 32 mmol/L   Glucose, Bld 101 (H) 70 - 99 mg/dL   BUN 13 6 - 20 mg/dL   Creatinine, Ser 4.780.65 0.61 - 1.24 mg/dL   Calcium 9.6 8.9 - 29.510.3 mg/dL   Total Protein 8.5 (H) 6.5 - 8.1 g/dL   Albumin 4.9 3.5 - 5.0 g/dL   AST 20 15 - 41 U/L   ALT 16 0 - 44 U/L   Alkaline Phosphatase 79 38 - 126 U/L   Total Bilirubin 0.3 0.3 - 1.2 mg/dL   GFR calc non Af Amer >60 >60 mL/min   GFR calc Af Amer >60 >60 mL/min   Anion gap 9 5 - 15  CBC with  Differential  Result Value Ref Range   WBC 14.4 (H) 4.0 - 10.5 K/uL   RBC 4.27 4.22 - 5.81 MIL/uL   Hemoglobin 13.0 13.0 - 17.0 g/dL   HCT 62.140.3 30.839.0 - 65.752.0 %   MCV 94.4 80.0 - 100.0 fL   MCH 30.4 26.0 - 34.0 pg   MCHC 32.3 30.0 - 36.0 g/dL   RDW 84.617.2 (H) 96.211.5 - 95.215.5 %   Platelets 473 (H) 150 - 400 K/uL   nRBC 0.0 0.0 - 0.2 %   Neutrophils Relative % 53 %   Neutro Abs 7.6 1.7 - 7.7 K/uL   Lymphocytes Relative 35 %   Lymphs Abs 5.0 (H) 0.7 - 4.0 K/uL   Monocytes Relative 10 %   Monocytes Absolute 1.5 (H) 0.1 - 1.0 K/uL   Eosinophils Relative 1 %   Eosinophils Absolute 0.2 0.0 - 0.5 K/uL   Basophils Relative 1 %   Basophils Absolute 0.1 0.0 - 0.1 K/uL   Immature Granulocytes 0 %   Abs Immature Granulocytes 0.04 0.00 - 0.07 K/uL   Ct Soft Tissue Neck W Contrast  Result Date: 12/26/2017 CLINICAL DATA:  Worsening  treated dental abscess. History of substance abuse. EXAM: CT NECK WITH CONTRAST TECHNIQUE: Multidetector CT imaging of the neck was performed using the standard protocol following the bolus administration of intravenous contrast. CONTRAST:  75mL ISOVUE-300 IOPAMIDOL (ISOVUE-300) INJECTION 61% COMPARISON:  CT neck December 23, 2017 FINDINGS: PHARYNX AND LARYNX: RIGHT floor mild reactive edema. Normal larynx. Widely patent airway. SALIVARY GLANDS: Normal. THYROID: Normal. LYMPH NODES: No lymphadenopathy by CT size criteria. Small reactive scattered lymph nodes. VASCULAR: Normal. LIMITED INTRACRANIAL: Normal. VISUALIZED ORBITS: Partially imaged scleral thickening. MASTOIDS AND VISUALIZED PARANASAL SINUSES: Well-aerated. SKELETON: Nonacute. Multiple dental caries. RIGHT mandible molar periapical abscess. UPPER CHEST: Lung apices are clear. No superior mediastinal lymphadenopathy. OTHER: 6 x 14 mm subperiosteal abscess RIGHT mandible body was 4 x 10 mm. Mild surrounding edema and subcutaneous fat stranding. IMPRESSION: 1. Enlarging 6 x 14 mm RIGHT mandible subperiosteal odontogenic abscess.  RIGHT neck reactive edema. Patent airway. 2. Similar mild scleral thickening may be infectious or inflammatory. Electronically Signed   By: Awilda Metro M.D.   On: 12/26/2017 02:23   Ct Soft Tissue Neck W Contrast  Result Date: 12/23/2017 CLINICAL DATA:  Dental abscess.  Jaw pain EXAM: CT NECK WITH CONTRAST TECHNIQUE: Multidetector CT imaging of the neck was performed using the standard protocol following the bolus administration of intravenous contrast. CONTRAST:  75mL OMNIPAQUE IOHEXOL 300 MG/ML  SOLN COMPARISON:  None. FINDINGS: Pharynx and larynx: Edema in the floor of the mouth on the right. Tongue and pharynx mildly displaced to the left. Mild hypertrophy of the pharyngeal tonsils bilaterally. 4 x 10 mm rim enhancing fluid collection medial to the posterior mandible on the right compatible with dental abscess from the right lower third molar Salivary glands: No inflammation, mass, or stone. Thyroid: Negative Lymph nodes: Right level 2 lymph nodes measuring 9 mm and 9.5 mm. Small posterior lymph nodes on the right. Left level 2 lymph node 8 mm. Vascular: Negative Limited intracranial: Negative Visualized orbits: Bilateral scleral thickening with shaggy borders. Question scleritis. No mass lesion. Mastoids and visualized paranasal sinuses: Negative Skeleton: Multiple caries. Right lower molar carries with small periapical lucency and adjacent soft tissue abscess. Upper chest: Negative Other: Edema in the soft tissues lateral and inferior to the mandible on the right related to cellulitis. IMPRESSION: 4 x 10 mm rim enhancing abscess medial to the right posterior mandible related to abscessed right lower third molar. There is edema in the floor of the mouth Edema floor of the mouth and parapharyngeal space on the right with mild displacement of the airway to the left. No airway compromise. Diffuse thickening of the sclera bilaterally, question scleritis. Electronically Signed   By: Marlan Palau M.D.    On: 12/23/2017 16:53

## 2018-01-03 ENCOUNTER — Emergency Department
Admission: EM | Admit: 2018-01-03 | Discharge: 2018-01-03 | Disposition: A | Discharge status: Home / Self Care | Payer: Self-pay | Attending: Emergency Medicine | Admitting: Emergency Medicine

## 2018-01-03 ENCOUNTER — Other Ambulatory Visit: Payer: Self-pay

## 2018-01-03 ENCOUNTER — Encounter: Payer: Self-pay | Admitting: *Deleted

## 2018-01-03 DIAGNOSIS — Z79899 Other long term (current) drug therapy: Secondary | ICD-10-CM | POA: Insufficient documentation

## 2018-01-03 DIAGNOSIS — L309 Dermatitis, unspecified: Secondary | ICD-10-CM

## 2018-01-03 DIAGNOSIS — L259 Unspecified contact dermatitis, unspecified cause: Secondary | ICD-10-CM | POA: Insufficient documentation

## 2018-01-03 DIAGNOSIS — F17200 Nicotine dependence, unspecified, uncomplicated: Secondary | ICD-10-CM | POA: Insufficient documentation

## 2018-01-03 MED ORDER — NYSTATIN-TRIAMCINOLONE 100000-0.1 UNIT/GM-% EX OINT: 1.0000 "application " | TOPICAL_OINTMENT | Freq: Two times a day (BID) | CUTANEOUS | 0 refills | Status: AC

## 2018-01-03 NOTE — ED Notes (Signed)
ED Provider at bedside. 

## 2018-01-03 NOTE — ED Triage Notes (Signed)
Pt has red itching painful rash around the anus for 4 days.  Pt had dental surgery last week and was on abx.  Pt finished abx 4 days ago.  Pt alert.  Speech clear.

## 2018-01-03 NOTE — Discharge Instructions (Signed)
Apply nystatin triamcinolone ointment to red areas along butt cheeks.  Apply Vaseline over nystatin triamcinolone ointment. Keep affected area clean without feces.

## 2018-01-03 NOTE — ED Provider Notes (Signed)
[facility] Emergency Department Provider Note  ____________________________________________  Time seen: Approximately 9:08 PM  I have reviewed the triage vital signs and the nursing notes.   HISTORY  Chief Complaint Rash    HPI REVIS Jesse Bullock is a 27 y.o. male presents to the emergency department with a perianal rash.  Patient reports that he has been on multiple antibiotics recently for a dental abscess.  Patient has had diarrhea intermittently for the past 4 to 5 days.  Patient denies fever and chills at home.  He has never had similar rash in the past.  He has tried Desitin at home which has not relieved his symptoms.    Past Medical History:  Diagnosis Date  . Allergy   . Bacterial heart infection   . High cholesterol   . Substance abuse (HCC)    prior, but has now detoxed    There are no active problems to display for this patient.   No past surgical history on file.  Prior to Admission medications   Medication Sig Start Date End Date Taking? Authorizing Provider  amoxicillin (AMOXIL) 500 MG tablet Take 1 tablet (500 mg total) by mouth 3 (three) times daily. Patient not taking: Reported on 12/26/2017 11/08/17   Kem Boroughs B, FNP  amoxicillin-clavulanate (AUGMENTIN) 875-125 MG tablet Take 1 tablet by mouth 2 (two) times daily. 12/22/17   Cuthriell, Delorise Royals, PA-C  buprenorphine-naloxone (SUBOXONE) 8-2 mg SUBL SL tablet Place 1 tablet under the tongue 3 (three) times daily. 12/06/17   [provider]  chlorhexidine (PERIDEX) 0.12 % solution Use as directed 10 mLs in the mouth or throat 2 (two) times daily. Swish and spit 12/22/17   Cuthriell, Delorise Royals, PA-C  chlorhexidine (PERIDEX) 0.12 % solution Use as directed 15 mLs in the mouth or throat 2 (two) times daily. 12/26/17   Merrily Brittle, MD  clindamycin (CLEOCIN) 300 MG capsule Take 1 capsule (300 mg total) by mouth 4 (four) times daily. 12/22/17   Cuthriell, Delorise Royals, PA-C   diltiazem (DILTIAZEM CD) 240 MG 24 hr capsule Take 1 capsule (240 mg total) by mouth daily. 04/06/16   Michiel Cowboy A, PA-C  gabapentin (NEURONTIN) 300 MG capsule Take 300 mg by mouth 3 (three) times daily. 12/06/17   [provider]  magic mouthwash w/lidocaine SOLN Take 5 mLs by mouth 4 (four) times daily. 12/22/17   Cuthriell, Delorise Royals, PA-C  nystatin-triamcinolone ointment (MYCOLOG) Apply 1 application topically 2 (two) times daily for 10 days. 01/03/18 01/13/18  Orvil Feil, PA-C  ondansetron (ZOFRAN-ODT) 4 MG disintegrating tablet Take 4 mg by mouth every 4 (four) hours as needed for nausea. 12/06/17   [provider]  traMADol (ULTRAM) 50 MG tablet Take 1 tablet (50 mg total) by mouth every 6 (six) hours as needed. 12/26/17 12/26/18  Merrily Brittle, MD    Allergies Ibuprofen; Aspirin; and Haldol [haloperidol lactate]  Family History  Problem Relation Age of Onset  . Heart disease Father   . Heart disease Paternal Grandmother     Social History Social History   Tobacco Use  . Smoking status: Current Every Day Smoker    Packs/day: 0.50  . Smokeless tobacco: Never Used  Substance Use Topics  . Alcohol use: No    Comment: 30+ days  . Drug use: No    Comment: detoxed- in treatment program     Review of Systems  Constitutional: No fever/chills Eyes: No visual changes. No discharge ENT: No upper respiratory  complaints. Cardiovascular: no chest pain. Respiratory: no cough. No SOB. Gastrointestinal: No abdominal pain.  No nausea, no vomiting.  No diarrhea.  No constipation. Genitourinary: Negative for dysuria. No hematuria Musculoskeletal: Negative for musculoskeletal pain. Skin: Patient has perianal rash.  Neurological: Negative for headaches, focal weakness or numbness.  ____________________________________________   PHYSICAL EXAM:  VITAL SIGNS: ED Triage Vitals  Enc Vitals Group     BP 01/03/18 1937 120/64     Pulse Rate 01/03/18 1937  62     Resp 01/03/18 1937 18     Temp 01/03/18 1937 98.8 F (37.1 C)     Temp Source 01/03/18 1937 Oral     SpO2 01/03/18 1937 96 %     Weight 01/03/18 1938 115 lb (52.2 kg)     Height 01/03/18 1938 5\' 6"  (1.676 m)     Head Circumference --      Peak Flow --      Pain Score 01/03/18 1938 5     Pain Loc --      Pain Edu? --      Excl. in GC? --      Constitutional: Alert and oriented. Well appearing and in no acute distress. Eyes: Conjunctivae are normal. PERRL. EOMI. Head: Atraumatic. Cardiovascular: Normal rate, regular rhythm. Normal S1 and S2.  Good peripheral circulation. Respiratory: Normal respiratory effort without tachypnea or retractions. Lungs CTAB. Good air entry to the bases with no decreased or absent breath sounds. Gastrointestinal: Bowel sounds 4 quadrants. Soft and nontender to palpation. No guarding or rigidity. No palpable masses. No distention. No CVA tenderness.  Patient has perianal erythema that extends to right and left gluteal regions.  No streaking. Musculoskeletal: Full range of motion to all extremities. No gross deformities appreciated. Neurologic:  Normal speech and language. No gross focal neurologic deficits are appreciated.  Skin:  Skin is warm, dry and intact. No rash noted. Psychiatric: Mood and affect are normal. Speech and behavior are normal. Patient exhibits appropriate insight and judgement.   ____________________________________________   LABS (all labs ordered are listed, but only abnormal results are displayed)  Labs Reviewed - No data to display ____________________________________________  EKG   ____________________________________________  RADIOLOGY   No results found.  ____________________________________________    PROCEDURES  Procedure(s) performed:    Procedures    Medications - No data to display   ____________________________________________   INITIAL IMPRESSION / ASSESSMENT AND PLAN / ED  COURSE  Pertinent labs & imaging results that were available during my care of the patient were reviewed by me and considered in my medical decision making (see chart for details).  Review of the [place] CSRS was performed in accordance of the [facility] prior to dispensing any controlled drugs.    Assessment and plan Contact dermatitis Patient presents to the emergency department with perianal erythema that occurred after patient had diarrhea for the past 4 to 5 days.  Advised patient to keep affected region clean without residual feces and to allow drying time after showering.  Patient was discharged with triamcinolone nystatin ointment as there are satellite lesions present on physical exam.  Patient was also advised to use Vaseline over triamcinolone nystatin ointment.  Patient voiced understanding.  All patient questions were answered.   ____________________________________________  FINAL CLINICAL IMPRESSION(S) / ED DIAGNOSES  Final diagnoses:  Dermatitis      NEW MEDICATIONS STARTED DURING THIS VISIT:  ED Discharge Orders         Ordered    nystatin-triamcinolone ointment (MYCOLOG)  2 times daily     01/03/18 2010              This chart was dictated using voice recognition software/Dragon. Despite best efforts to proofread, errors can occur which can change the meaning. Any change was purely unintentional.    Orvil Feil, PA-C 01/03/18 2112    Emily Filbert, MD 01/03/18 2322

## 2018-04-09 ENCOUNTER — Emergency Department
Admission: EM | Admit: 2018-04-09 | Discharge: 2018-04-09 | Disposition: A | Discharge status: Home / Self Care | Payer: Self-pay | Attending: Emergency Medicine | Admitting: Emergency Medicine

## 2018-04-09 ENCOUNTER — Other Ambulatory Visit: Payer: Self-pay

## 2018-04-09 ENCOUNTER — Encounter: Payer: Self-pay | Admitting: Emergency Medicine

## 2018-04-09 DIAGNOSIS — F17210 Nicotine dependence, cigarettes, uncomplicated: Secondary | ICD-10-CM | POA: Insufficient documentation

## 2018-04-09 DIAGNOSIS — Z79899 Other long term (current) drug therapy: Secondary | ICD-10-CM | POA: Insufficient documentation

## 2018-04-09 DIAGNOSIS — K047 Periapical abscess without sinus: Secondary | ICD-10-CM | POA: Insufficient documentation

## 2018-04-09 MED ORDER — AMOXICILLIN 500 MG PO CAPS: 500.0000 mg | ORAL_CAPSULE | Freq: Three times a day (TID) | ORAL | 0 refills | Status: DC

## 2018-04-09 MED ORDER — LIDOCAINE VISCOUS HCL 2 % MT SOLN: 10.0000 mL | OROMUCOSAL | 0 refills | Status: DC | PRN

## 2018-04-09 NOTE — ED Notes (Signed)
Reports known dental problem, positive swelling to right side of face. Denies fever. Awaiting Md eval and plan of care.

## 2018-04-09 NOTE — ED Triage Notes (Signed)
PT arrives with concerns over possible dental abscess. Pain started 3 days prior. PT states he is here for an antibiotic.

## 2018-04-09 NOTE — Discharge Instructions (Signed)
OPTIONS FOR DENTAL FOLLOW UP CARE ° °[place] Department of Health and Human Services - Local Safety Net Dental Clinics °http://www.ncdhhs.gov/dph/oralhealth/services/safetynetclinics.htm °  °[facility] (336-562-3123) ° °[facility] ([phone]) ° °[facility] ([phone] ext [phone]) ° °[facility] ([phone]) ° °[facility] ([phone]) °This clinic caters to the indigent population and is on a lottery system. °Location: °[facility], [address], [place] °Clinic Hours: °Wednesdays from 6pm - 9pm, patients seen by a lottery system. °For dates, call or go to [email or web address].[facility].edu/[facility]/patients/Dental-[facility] °Services: °Cleanings, fillings and simple extractions. °Payment Options: °DENTAL WORK IS FREE OF CHARGE. Bring proof of income or support. °Best way to get seen: °Arrive at 5:15 pm - this is a lottery, NOT first come/first serve, so arriving earlier will not increase your chances of being seen. °  °  °[facility] °[phone] °Select option 1 for emergencies °  °Location: °[facility], [address], [place] °Clinic Hours: °No walk-ins accepted - call the day before to schedule an appointment. °Check in times are 9:30 am and 1:30 pm. °Services: °Simple extractions, temporary fillings, pulpectomy/pulp debridement, uncomplicated abscess drainage. °Payment Options: °PAYMENT IS DUE AT THE TIME OF SERVICE.  Fee is usually $100-200, additional surgical procedures (e.g. abscess drainage) may be extra. °Cash, checks, Visa/MasterCard accepted.  Can file Medicaid if patient is covered for dental - patient should call case worker to check. °No discount for [facility] patients. °Best way to get seen: °MUST call the day before and get onto the schedule. Can usually be seen the next 1-2 days. No walk-ins accepted. °  °  °[facility] °[phone] °   °Location: °[facility], [address], [place] °Clinic Hours: °M, W, Th, F 8am or 1:30pm, Tues 9a or 1:30 - first come/first served. °Services: °Simple extractions, temporary fillings, uncomplicated abscess drainage.  You do not need to be an [place] resident. °Payment Options: °PAYMENT IS DUE AT THE TIME OF SERVICE. °Dental insurance, otherwise sliding scale - bring proof of income or support. °Depending on income and treatment needed, cost is usually $50-200. °Best way to get seen: °Arrive early as it is first come/first served. °  °  °[facility] °[phone] °  °Location: °[address] °Clinic Hours: °Mon-Thu 8a-5p °Services: °Most basic dental services including extractions and fillings. °Payment Options: °PAYMENT IS DUE AT THE TIME OF SERVICE. °Sliding scale, up to 50% off - bring proof if income or support. °Medicaid with dental option accepted. °Best way to get seen: °Call to schedule an appointment, can usually be seen within 2 weeks OR they will try to see walk-ins - show up at 8a or 2p (you may have to wait). °  °  °[facility] °[phone] °[place] RESIDENTS ONLY °  °Location: °[facility], [address], [place], [place] [address] °Clinic Hours: By appointment only. °Monday - Thursday 8am-5pm, Friday 8am-12pm °Services: Cleanings, fillings, extractions. °Payment Options: °PAYMENT IS DUE AT THE TIME OF SERVICE. °Cash, Visa or MasterCard. Sliding scale - $30 minimum per service. °Best way to get seen: °Come in to office, complete packet and make an appointment - need proof of income °or support monies for each household member and proof of [place] residence. °Usually takes about a month to get in. °  °  °[facility] °[phone] °  °Location: °[address]   [place] °Clinic Hours: Walk-in Urgent Care Dental Services are offered Monday-Friday  mornings only. °The numbers of emergencies accepted daily is limited to the number of °providers available. °Maximum 15 - Mondays, Wednesdays & Thursdays °Maximum 10 - Tuesdays & Fridays °Services: °You do not need to be a  [place] resident to be seen for a dental emergency. °Emergencies are defined as pain, swelling, abnormal bleeding, or dental trauma. Walkins will receive x-rays if needed. °NOTE: Dental cleaning is not an emergency. °Payment Options: °PAYMENT IS DUE AT THE TIME OF SERVICE. °Minimum co-pay is $40.00 for uninsured patients. °Minimum co-pay is $3.00 for Medicaid with dental coverage. °Dental Insurance is accepted and must be presented at time of visit. °Medicare does not cover dental. °Forms of payment: Cash, credit card, checks. °Best way to get seen: °If not previously registered with the clinic, walk-in dental registration begins at 7:15 am and is on a first come/first serve basis. °If previously registered with the clinic, call to make an appointment. °  °  °[facility] °[phone] °[place] RESIDENTS ONLY °  °Location: °[address], [place], [place] °Clinic Hours: °Mon-Thu 10a-2p °Services: Extractions only! °Payment Options: °FREE (donations accepted) - bring proof of income or support °Best way to get seen: °Call and schedule an appointment OR come at 8am on the 1st Monday of every month (except for holidays) when it is first come/first served. °  °  °[facility] °[phone] °  °Location: °[address], [place] °Clinic Hours: °Friday mornings °Services, Payment Options, Best way to get seen: °Call for info °

## 2018-04-09 NOTE — ED Provider Notes (Signed)
[facility] Emergency Department Provider Note  ____________________________________________  Time seen: Approximately 5:43 PM  I have reviewed the triage vital signs and the nursing notes.   HISTORY  Chief Complaint Dental Pain    HPI Jesse Bullock is a 28 y.o. male that presents to the emergency department for evaluation of right bottom dental pain for 3 days.  He states that tooth has a large cavity.  Patient does not have a dentist.  He tried to go to the free clinic but states that they are close due to the current pandemic.  No fevers.   Past Medical History:  Diagnosis Date  . Allergy   . Bacterial heart infection   . High cholesterol   . Substance abuse (HCC)    prior, but has now detoxed    There are no active problems to display for this patient.   History reviewed. No pertinent surgical history.  Prior to Admission medications   Medication Sig Start Date End Date Taking? Authorizing Provider  amoxicillin (AMOXIL) 500 MG capsule Take 1 capsule (500 mg total) by mouth 3 (three) times daily. 04/09/18   Enid Derry, PA-C  amoxicillin-clavulanate (AUGMENTIN) 875-125 MG tablet Take 1 tablet by mouth 2 (two) times daily. 12/22/17   Cuthriell, Delorise Royals, PA-C  buprenorphine-naloxone (SUBOXONE) 8-2 mg SUBL SL tablet Place 1 tablet under the tongue 3 (three) times daily. 12/06/17   [provider]  chlorhexidine (PERIDEX) 0.12 % solution Use as directed 10 mLs in the mouth or throat 2 (two) times daily. Swish and spit 12/22/17   Cuthriell, Delorise Royals, PA-C  chlorhexidine (PERIDEX) 0.12 % solution Use as directed 15 mLs in the mouth or throat 2 (two) times daily. 12/26/17   Merrily Brittle, MD  clindamycin (CLEOCIN) 300 MG capsule Take 1 capsule (300 mg total) by mouth 4 (four) times daily. 12/22/17   Cuthriell, Delorise Royals, PA-C  diltiazem (DILTIAZEM CD) 240 MG 24 hr capsule Take 1 capsule (240 mg total) by mouth daily. 04/06/16    Michiel Cowboy A, PA-C  gabapentin (NEURONTIN) 300 MG capsule Take 300 mg by mouth 3 (three) times daily. 12/06/17   [provider]  lidocaine (XYLOCAINE) 2 % solution Use as directed 10 mLs in the mouth or throat as needed. 04/09/18   Enid Derry, PA-C  magic mouthwash w/lidocaine SOLN Take 5 mLs by mouth 4 (four) times daily. 12/22/17   Cuthriell, Delorise Royals, PA-C  ondansetron (ZOFRAN-ODT) 4 MG disintegrating tablet Take 4 mg by mouth every 4 (four) hours as needed for nausea. 12/06/17   [provider]  traMADol (ULTRAM) 50 MG tablet Take 1 tablet (50 mg total) by mouth every 6 (six) hours as needed. 12/26/17 12/26/18  Merrily Brittle, MD    Allergies Ibuprofen; Aspirin; and Haldol [haloperidol lactate]  Family History  Problem Relation Age of Onset  . Heart disease Father   . Heart disease Paternal Grandmother     Social History Social History   Tobacco Use  . Smoking status: Current Every Day Smoker    Packs/day: 0.50  . Smokeless tobacco: Never Used  Substance Use Topics  . Alcohol use: No    Comment: 30+ days  . Drug use: No    Comment: detoxed- in treatment program     Review of Systems  Constitutional: No fever/chills Respiratory: No SOB. Gastrointestinal: No nausea, no vomiting.  Musculoskeletal: Negative for musculoskeletal pain. Skin: Negative for rash, abrasions, lacerations, ecchymosis.   ____________________________________________   PHYSICAL EXAM:  VITAL SIGNS: ED Triage Vitals  Enc Vitals Group     BP 04/09/18 1643 136/74     Pulse Rate 04/09/18 1643 62     Resp 04/09/18 1643 20     Temp 04/09/18 1643 98.5 F (36.9 C)     Temp Source 04/09/18 1643 Oral     SpO2 04/09/18 1643 96 %     Weight 04/09/18 1643 115 lb (52.2 kg)     Height 04/09/18 1643  (1.702 m)     Head Circumference --      Peak Flow --      Pain Score 04/09/18 1646 8     Pain Loc --      Pain Edu? --      Excl. in GC? --      Constitutional:  Alert and oriented. Well appearing and in no acute distress. Eyes: Conjunctivae are normal. PERRL. EOMI. Head: Atraumatic. ENT:      Ears:      Nose: No congestion/rhinnorhea.      Mouth/Throat: Mucous membranes are moist. Large cavities in tooth 27 and 28 with surrounding tenderness to palpation. No swelling. Full ROM of jaw.  Neck: No stridor.   Cardiovascular: Good peripheral circulation. Respiratory: Normal respiratory effort without tachypnea or retractions. Musculoskeletal: Full range of motion to all extremities. No gross deformities appreciated. Neurologic:  Normal speech and language. No gross focal neurologic deficits are appreciated.  Skin:  Skin is warm, dry and intact. No rash noted. Psychiatric: Mood and affect are normal. Speech and behavior are normal. Patient exhibits appropriate insight and judgement.   ____________________________________________   LABS (all labs ordered are listed, but only abnormal results are displayed)  Labs Reviewed - No data to display ____________________________________________  EKG   ____________________________________________  RADIOLOGY  No results found.  ____________________________________________    PROCEDURES  Procedure(s) performed:    Procedures    Medications - No data to display   ____________________________________________   INITIAL IMPRESSION / ASSESSMENT AND PLAN / ED COURSE  Pertinent labs & imaging results that were available during my care of the patient were reviewed by me and considered in my medical decision making (see chart for details).  Review of the [place] CSRS was performed in accordance of the [facility] prior to dispensing any controlled drugs.     Patient's diagnosis is consistent with dental infection. Patient will be discharged home with prescriptions for amoxicillin and viscous lidocaine. Patient is to follow up with dentist as directed. Dental resources were provided. Patient is given ED  precautions to return to the ED for any worsening or new symptoms.     ____________________________________________  FINAL CLINICAL IMPRESSION(S) / ED DIAGNOSES  Final diagnoses:  Dental infection      NEW MEDICATIONS STARTED DURING THIS VISIT:  ED Discharge Orders         Ordered    amoxicillin (AMOXIL) 500 MG capsule  3 times daily,   Status:  Discontinued     04/09/18 1745    lidocaine (XYLOCAINE) 2 % solution  As needed,   Status:  Discontinued     04/09/18 1745    amoxicillin (AMOXIL) 500 MG capsule  3 times daily     04/09/18 1755    lidocaine (XYLOCAINE) 2 % solution  As needed     04/09/18 1755              This chart was dictated using voice recognition software/Dragon. Despite best efforts to proofread, errors can occur  which can change the meaning. Any change was purely unintentional.    Enid Derry, PA-C 04/09/18 1801    Dionne Bucy, MD 04/09/18 2043

## 2018-04-15 ENCOUNTER — Other Ambulatory Visit: Payer: Self-pay

## 2018-04-15 ENCOUNTER — Emergency Department
Admission: EM | Admit: 2018-04-15 | Discharge: 2018-04-15 | Disposition: A | Discharge status: Home / Self Care | Payer: Self-pay | Attending: Emergency Medicine | Admitting: Emergency Medicine

## 2018-04-15 ENCOUNTER — Encounter: Payer: Self-pay | Admitting: Emergency Medicine

## 2018-04-15 DIAGNOSIS — K089 Disorder of teeth and supporting structures, unspecified: Secondary | ICD-10-CM

## 2018-04-15 DIAGNOSIS — Z79899 Other long term (current) drug therapy: Secondary | ICD-10-CM | POA: Insufficient documentation

## 2018-04-15 DIAGNOSIS — F17210 Nicotine dependence, cigarettes, uncomplicated: Secondary | ICD-10-CM | POA: Insufficient documentation

## 2018-04-15 DIAGNOSIS — K0889 Other specified disorders of teeth and supporting structures: Secondary | ICD-10-CM | POA: Insufficient documentation

## 2018-04-15 DIAGNOSIS — G8929 Other chronic pain: Secondary | ICD-10-CM | POA: Insufficient documentation

## 2018-04-15 MED ORDER — CLINDAMYCIN HCL 300 MG PO CAPS: 300.0000 mg | ORAL_CAPSULE | Freq: Three times a day (TID) | ORAL | 0 refills | Status: AC

## 2018-04-15 MED ORDER — CLINDAMYCIN HCL 300 MG PO CAPS: 300.0000 mg | ORAL_CAPSULE | Freq: Three times a day (TID) | ORAL | 0 refills | Status: DC

## 2018-04-15 MED ORDER — ONDANSETRON HCL 4 MG PO TABS: 4.0000 mg | ORAL_TABLET | Freq: Three times a day (TID) | ORAL | 0 refills | Status: AC | PRN

## 2018-04-15 NOTE — ED Provider Notes (Signed)
[facility] Emergency Department Provider Note  ____________________________________________   I have reviewed the triage vital signs and the nursing notes. Where available I have reviewed prior notes and, if possible and indicated, outside hospital notes.    HISTORY  Chief Complaint Dental Problem    HPI Jesse Bullock is a 28 y.o. male history of chronic dental pain, chronic recurrent dental infections, she had multiple drug abuse in the past, had a tooth pulled a couple days ago at [facility] after he was transferred to [facility] for dental infection.  He states he has not had any diarrhea cough or fever but he has had some nausea and almost threw.  He is worried that the swelling is getting worse.  I asked him why he did go see the dentist at [facility] who actually pulled his tooth, he states he did not think he needed to.  The patient has had no fever.  He wants me to render him unconscious and cut his gum open to see if there is an abscess forming.    Past Medical History:  Diagnosis Date  . Allergy   . Bacterial heart infection   . High cholesterol   . Substance abuse (HCC)    prior, but has now detoxed    There are no active problems to display for this patient.   History reviewed. No pertinent surgical history.  Prior to Admission medications   Medication Sig Start Date End Date Taking? Authorizing Provider  amoxicillin (AMOXIL) 500 MG capsule Take 1 capsule (500 mg total) by mouth 3 (three) times daily. 04/09/18   Enid Derry, PA-C  amoxicillin-clavulanate (AUGMENTIN) 875-125 MG tablet Take 1 tablet by mouth 2 (two) times daily. 12/22/17   Cuthriell, Delorise Royals, PA-C  buprenorphine-naloxone (SUBOXONE) 8-2 mg SUBL SL tablet Place 1 tablet under the tongue 3 (three) times daily. 12/06/17   [provider]  chlorhexidine (PERIDEX) 0.12 % solution Use as directed 10 mLs in the mouth or throat 2 (two) times daily. Swish and spit 12/22/17   Cuthriell,  Delorise Royals, PA-C  chlorhexidine (PERIDEX) 0.12 % solution Use as directed 15 mLs in the mouth or throat 2 (two) times daily. 12/26/17   Merrily Brittle, MD  clindamycin (CLEOCIN) 300 MG capsule Take 1 capsule (300 mg total) by mouth 4 (four) times daily. 12/22/17   Cuthriell, Delorise Royals, PA-C  diltiazem (DILTIAZEM CD) 240 MG 24 hr capsule Take 1 capsule (240 mg total) by mouth daily. 04/06/16   Michiel Cowboy A, PA-C  gabapentin (NEURONTIN) 300 MG capsule Take 300 mg by mouth 3 (three) times daily. 12/06/17   [provider]  lidocaine (XYLOCAINE) 2 % solution Use as directed 10 mLs in the mouth or throat as needed. 04/09/18   Enid Derry, PA-C  magic mouthwash w/lidocaine SOLN Take 5 mLs by mouth 4 (four) times daily. 12/22/17   Cuthriell, Delorise Royals, PA-C  ondansetron (ZOFRAN-ODT) 4 MG disintegrating tablet Take 4 mg by mouth every 4 (four) hours as needed for nausea. 12/06/17   [provider]  traMADol (ULTRAM) 50 MG tablet Take 1 tablet (50 mg total) by mouth every 6 (six) hours as needed. 12/26/17 12/26/18  Merrily Brittle, MD    Allergies Ibuprofen; Aspirin; and Haldol [haloperidol lactate]  Family History  Problem Relation Age of Onset  . Heart disease Father   . Heart disease Paternal Grandmother     Social History Social History   Tobacco Use  . Smoking status: Current Every Day  Smoker    Packs/day: 0.50  . Smokeless tobacco: Never Used  Substance Use Topics  . Alcohol use: No    Comment: 30+ days  . Drug use: No    Comment: detoxed- in treatment program    Review of Systems Constitutional: No fever/chills Eyes: No visual changes. ENT: No sore throat. No stiff neck no neck pain Cardiovascular: Denies chest pain. Respiratory: Denies shortness of breath. Gastrointestinal: Positive nausea no diarrhea.  No constipation. Genitourinary: Negative for dysuria. Musculoskeletal: Negative lower extremity swelling Skin: Negative for rash. Neurological:  Negative for severe headaches, focal weakness or numbness.   ____________________________________________   PHYSICAL EXAM:  VITAL SIGNS: ED Triage Vitals  Enc Vitals Group     BP 04/15/18 1222 128/77     Pulse Rate 04/15/18 1222 86     Resp 04/15/18 1222 16     Temp 04/15/18 1222 98.1 F (36.7 C)     Temp Source 04/15/18 1222 Oral     SpO2 04/15/18 1222 98 %     Weight 04/15/18 1221 115 lb (52.2 kg)     Height 04/15/18 1221  (1.702 m)     Head Circumference --      Peak Flow --      Pain Score 04/15/18 1220 8     Pain Loc --      Pain Edu? --      Excl. in GC? --     Constitutional: Alert and oriented. Well appearing and in no acute distress. Eyes: Conjunctivae are normal Head: Atraumatic HEENT: No congestion/rhinnorhea.  Oropharynx is clear there is very slight swelling noted around the site of the area where they pulled the tooth the tooth the area where the tooth was pulled is not itself red or infected in appearance is no purulence.  There is no fluctuance noted, there is no induration.  Just a slight increase in swelling.  This certainly could be a reaction to the procedure.  I do not see any evidence of an abscess that can be drained at this time. Neck:   Nontender with no meningismus, no masses, no stridor Cardiovascular: Normal rate, regular rhythm. Grossly normal heart sounds.  Good peripheral circulation. Respiratory: Normal respiratory effort.  No retractions. Lungs CTAB. Abdominal: Soft and nontender. No distention. No guarding no rebound Back:  There is no focal tenderness or step off.  there is no midline tenderness there are no lesions noted. there is no CVA tenderness Musculoskeletal: No lower extremity tenderness, no upper extremity tenderness. No joint effusions, no DVT signs strong distal pulses no edema Neurologic:  Normal speech and language. No gross focal neurologic deficits are appreciated.  Skin:  Skin is warm, dry and intact. No rash  noted. Psychiatric: Mood and affect are normal. Speech and behavior are normal.  ____________________________________________   LABS (all labs ordered are listed, but only abnormal results are displayed)  Labs Reviewed - No data to display  Pertinent labs  results that were available during my care of the patient were reviewed by me and considered in my medical decision making (see chart for details). ____________________________________________  EKG  I personally interpreted any EKGs ordered by me or triage ________________________________________  RADIOLOGY  Pertinent labs & imaging results that were available during my care of the patient were reviewed by me and considered in my medical decision making (see chart for details). If possible, patient and/or family made aware of any abnormal findings.  No results found. ____________________________________________    PROCEDURES  Procedure(s) performed: None  Procedures  Critical Care performed: None  ____________________________________________   INITIAL IMPRESSION / ASSESSMENT AND PLAN / ED COURSE  Pertinent labs & imaging results that were available during my care of the patient were reviewed by me and considered in my medical decision making (see chart for details).  Patient here with a little bit of swelling around the area where his tooth was pulled at another facility couple days ago.  I have encouraged him that he does need to follow-up with his actual dentist as we are not a dentist and we did not do the procedure here.  In addition, I do not see any indication at this time for invasive procedure.  See no evidence of abscess or systemic illness, his vital signs are completely normal there is no evidence of sepsis.  I also do not see anything I can likely drain here.  I am very reluctant to do a fine-needle aspiration of the area because total notes indicate the patient does not tolerate procedures well without  sedation and I certainly do not think sedation is indicated, and also in the middle of a coronavirus pandemic that can present with GI symptoms, I do not think it is in the patient or staff's best interest to be doing unnecessary procedures that I feel are not going to be productive of positive outcomes at at this time.  Given all of that, we will try to change his antibiotic to clindamycin which I think may help him, we have given him extensive return precautions and follow-up and we will have him return to see his dentist as already scheduled.  There is no evidence of Ludwig's angina no swelling underneath the tongue difficulty breathing airway issues angioedema etc.    ____________________________________________   FINAL CLINICAL IMPRESSION(S) / ED DIAGNOSES  Final diagnoses:  None      This chart was dictated using voice recognition software.  Despite best efforts to proofread,  errors can occur which can change meaning.      Jeanmarie Plant, MD 04/15/18 1308

## 2018-04-15 NOTE — Discharge Instructions (Signed)
If you have increased pain high fever increased swelling, or other concerns including difficulty breathing or trouble swallowing return to the emergency room.  We strongly advised that you follow-up closely with a dentist who pulled your tooth.  Please call them and you get home.  Take the antibiotics directed.

## 2018-04-15 NOTE — ED Notes (Signed)

## 2018-04-15 NOTE — ED Triage Notes (Signed)
Pt here for swelling to right jaw.  Was seen multiple times then tooth was pulled at [facility].  Swelling getting worse per pt. Significant pain. Now also having vomiting. Hx splenectomy.

## 2018-08-12 ENCOUNTER — Other Ambulatory Visit: Payer: Self-pay

## 2018-08-12 ENCOUNTER — Emergency Department
Admission: EM | Admit: 2018-08-12 | Discharge: 2018-08-12 | Disposition: A | Discharge status: Home / Self Care | Payer: Self-pay | Attending: Emergency Medicine | Admitting: Emergency Medicine

## 2018-08-12 DIAGNOSIS — K0889 Other specified disorders of teeth and supporting structures: Secondary | ICD-10-CM | POA: Insufficient documentation

## 2018-08-12 DIAGNOSIS — K029 Dental caries, unspecified: Secondary | ICD-10-CM | POA: Insufficient documentation

## 2018-08-12 DIAGNOSIS — F17210 Nicotine dependence, cigarettes, uncomplicated: Secondary | ICD-10-CM | POA: Insufficient documentation

## 2018-08-12 DIAGNOSIS — Z79899 Other long term (current) drug therapy: Secondary | ICD-10-CM | POA: Insufficient documentation

## 2018-08-12 MED ORDER — PENICILLIN V POTASSIUM 250 MG PO TABS
500.0000 mg | ORAL_TABLET | Freq: Once | ORAL | Status: AC
  Administered 2018-08-12: 500 mg via ORAL
  Filled 2018-08-12: qty 2

## 2018-08-12 MED ORDER — PENICILLIN V POTASSIUM 500 MG PO TABS: 500.0000 mg | ORAL_TABLET | Freq: Four times a day (QID) | ORAL | 0 refills | Status: AC

## 2018-08-12 NOTE — ED Triage Notes (Addendum)
Patient reports tooth pain to left lower for couple weeks.  Patient reports he took suboxone approximately and 1 hour prior to arrival.

## 2018-08-12 NOTE — Discharge Instructions (Addendum)
OPTIONS FOR DENTAL FOLLOW UP CARE ° °[place] Department of Health and Human Services - Local Safety Net Dental Clinics °http://www.ncdhhs.gov/dph/oralhealth/services/safetynetclinics.htm °  °[facility] (336-562-3123) ° °[facility] [place] (919-933-9087) ° °[facility] ([phone] ext [phone]) ° °[facility] ([phone]) ° °[facility] ([phone]) °This clinic caters to the indigent population and is on a lottery system. °Location: °[facility], [address], [place] °Clinic Hours: °Wednesdays from 6pm - 9pm, patients seen by a lottery system. °For dates, call or go to [email or web address].[email or web address]/[facility]/patients/[email or web address] °Services: °Cleanings, fillings and simple extractions. °Payment Options: °DENTAL WORK IS FREE OF CHARGE. Bring proof of income or support. °Best way to get seen: °Arrive at 5:15 pm - this is a lottery, NOT first come/first serve, so arriving earlier will not increase your chances of being seen. °  °  °[facility] °[phone] °Select option 1 for emergencies °  °Location: °[facility], [address], [place] °Clinic Hours: °No walk-ins accepted - call the day before to schedule an appointment. °Check in times are 9:30 am and 1:30 pm. °Services: °Simple extractions, temporary fillings, pulpectomy/pulp debridement, uncomplicated abscess drainage. °Payment Options: °PAYMENT IS DUE AT THE TIME OF SERVICE.  Fee is usually $100-200, additional surgical procedures (e.g. abscess drainage) may be extra. °Cash, checks, Visa/MasterCard accepted.  Can file Medicaid if patient is covered for dental - patient should call case worker to check. °No discount for [facility] patients. °Best way to get seen: °MUST call the day before and get onto the schedule. Can usually be seen the next 1-2 days. No walk-ins accepted. °  °  °[facility] °[phone] °   °Location: °[facility], [address], [place] °Clinic Hours: °M, W, Th, F 8am or 1:30pm, Tues 9a or 1:30 - first come/first served. °Services: °Simple extractions, temporary fillings, uncomplicated abscess drainage.  You do not need to be an [place] resident. °Payment Options: °PAYMENT IS DUE AT THE TIME OF SERVICE. °Dental insurance, otherwise sliding scale - bring proof of income or support. °Depending on income and treatment needed, cost is usually $50-200. °Best way to get seen: °Arrive early as it is first come/first served. °  °  °[facility] °[phone] °  °Location: °[address] °Clinic Hours: °Mon-Thu 8a-5p °Services: °Most basic dental services including extractions and fillings. °Payment Options: °PAYMENT IS DUE AT THE TIME OF SERVICE. °Sliding scale, up to 50% off - bring proof if income or support. °Medicaid with dental option accepted. °Best way to get seen: °Call to schedule an appointment, can usually be seen within 2 weeks OR they will try to see walk-ins - show up at 8a or 2p (you may have to wait). °  °  °[facility] °919-245-2435 °[place] RESIDENTS ONLY °  °Location: °[facility], [address], [place], [place] [address] °Clinic Hours: By appointment only. °Monday - Thursday 8am-5pm, Friday 8am-12pm °Services: Cleanings, fillings, extractions. °Payment Options: °PAYMENT IS DUE AT THE TIME OF SERVICE. °Cash, Visa or MasterCard. Sliding scale - $30 minimum per service. °Best way to get seen: °Come in to office, complete packet and make an appointment - need proof of income °or support monies for each household member and proof of [place] residence. °Usually takes about a month to get in. °  °  °[facility] °[phone] °  °Location: °[address]   [place] °Clinic Hours: Walk-in Urgent Care Dental Services are offered Monday-Friday  mornings only. °The numbers of emergencies accepted daily is limited to the number of °providers available. °Maximum 15 - Mondays, Wednesdays & Thursdays °Maximum 10 - Tuesdays & Fridays °Services: °You do not need to be a [place] resident to be seen for a dental emergency. °Emergencies are defined as pain, swelling, abnormal bleeding, or dental trauma. Walkins will receive x-rays if needed. °NOTE: Dental cleaning is not an emergency. °Payment Options: °PAYMENT IS DUE AT THE TIME OF SERVICE. °Minimum co-pay is $40.00 for uninsured patients. °Minimum co-pay is $3.00 for Medicaid with dental coverage. °Dental Insurance is accepted and must be presented at time of visit. °Medicare does not cover dental. °Forms of payment: Cash, credit card, checks. °Best way to get seen: °If not previously registered with the clinic, walk-in dental registration begins at 7:15 am and is on a first come/first serve basis. °If previously registered with the clinic, call to make an appointment. °  °  °[facility] °919-776-4359 °[place] RESIDENTS ONLY °  °Location: °[address], [place], [place] °Clinic Hours: °Mon-Thu 10a-2p °Services: Extractions only! °Payment Options: °FREE (donations accepted) - bring proof of income or support °Best way to get seen: °Call and schedule an appointment OR come at 8am on the 1st Monday of every month (except for holidays) when it is first come/first served. °  °  °[facility] °[phone] °  °Location: °[address], [place] °Clinic Hours: °Friday mornings °Services, Payment Options, Best way to get seen: °Call for info °

## 2018-08-12 NOTE — ED Provider Notes (Signed)
[facility] Emergency Department Provider Note  Time seen: 7:28 AM  I have reviewed the triage vital signs and the nursing notes.   HISTORY  Chief Complaint Dental Pain   HPI Jesse Bullock is a 28 y.o. male with a past medical history of substance abuse, hyperlipidemia, presents to the emergency department for left lower dental pain.  According to the patient for the past 2 weeks he has had pain in his left lower molar.  States over the past 2 days it has worsened.  Denies any fever.  Denies any cough or congestion.  Denies any facial pain or swelling.   Past Medical History:  Diagnosis Date  . Allergy   . Bacterial heart infection   . High cholesterol   . Substance abuse (HCC)    prior, but has now detoxed    There are no active problems to display for this patient.   No past surgical history on file.  Prior to Admission medications   Medication Sig Start Date End Date Taking? Authorizing Provider  amoxicillin (AMOXIL) 500 MG capsule Take 1 capsule (500 mg total) by mouth 3 (three) times daily. 04/09/18   Enid DerryWagner, Ashley, PA-C  amoxicillin-clavulanate (AUGMENTIN) 875-125 MG tablet Take 1 tablet by mouth 2 (two) times daily. 12/22/17   Cuthriell, Delorise RoyalsJonathan D, PA-C  buprenorphine-naloxone (SUBOXONE) 8-2 mg SUBL SL tablet Place 1 tablet under the tongue 3 (three) times daily. 12/06/17   [provider]  chlorhexidine (PERIDEX) 0.12 % solution Use as directed 10 mLs in the mouth or throat 2 (two) times daily. Swish and spit 12/22/17   Cuthriell, Delorise RoyalsJonathan D, PA-C  chlorhexidine (PERIDEX) 0.12 % solution Use as directed 15 mLs in the mouth or throat 2 (two) times daily. 12/26/17   Merrily Brittleifenbark, Neil, MD  diltiazem (DILTIAZEM CD) 240 MG 24 hr capsule Take 1 capsule (240 mg total) by mouth daily. 04/06/16   Michiel CowboyMcGowan, Shannon A, PA-C  gabapentin (NEURONTIN) 300 MG capsule Take 300 mg by mouth 3 (three) times daily. 12/06/17   [provider]   lidocaine (XYLOCAINE) 2 % solution Use as directed 10 mLs in the mouth or throat as needed. 04/09/18   Enid DerryWagner, Ashley, PA-C  magic mouthwash w/lidocaine SOLN Take 5 mLs by mouth 4 (four) times daily. 12/22/17   Cuthriell, Delorise RoyalsJonathan D, PA-C  ondansetron (ZOFRAN) 4 MG tablet Take 1 tablet (4 mg total) by mouth every 8 (eight) hours as needed for nausea or vomiting. 04/15/18   Jeanmarie PlantMcShane, James A, MD  ondansetron (ZOFRAN-ODT) 4 MG disintegrating tablet Take 4 mg by mouth every 4 (four) hours as needed for nausea. 12/06/17   [provider]  traMADol (ULTRAM) 50 MG tablet Take 1 tablet (50 mg total) by mouth every 6 (six) hours as needed. 12/26/17 12/26/18  Merrily Brittleifenbark, Neil, MD    Allergies  Allergen Reactions  . Ibuprofen Anaphylaxis  . Aspirin   . Haldol [Haloperidol Lactate]     Family History  Problem Relation Age of Onset  . Heart disease Father   . Heart disease Paternal Grandmother     Social History Social History   Tobacco Use  . Smoking status: Current Every Day Smoker    Packs/day: 0.50  . Smokeless tobacco: Never Used  Substance Use Topics  . Alcohol use: No    Comment: 30+ days  . Drug use: No    Comment: detoxed- in treatment program    Review of Systems Constitutional: Negative for fever. ENT: Left lower dental  pain Respiratory: Negative for shortness of breath.  Negative for cough Skin: Negative for skin complaints  Neurological: Negative for headache All other ROS negative  ____________________________________________   PHYSICAL EXAM:  VITAL SIGNS: ED Triage Vitals  Enc Vitals Group     BP 08/12/18 0609 (!) 104/52     Pulse Rate 08/12/18 0609 (!) 54     Resp 08/12/18 0609 18     Temp 08/12/18 0609 98.1 F (36.7 C)     Temp Source 08/12/18 0609 Oral     SpO2 08/12/18 0609 96 %     Weight 08/12/18 0558 110 lb (49.9 kg)     Height 08/12/18 0558 5\' 6"  (1.676 m)     Head Circumference --      Peak Flow --      Pain Score 08/12/18 0558 7     Pain  Loc --      Pain Edu? --      Excl. in [place]? --     Constitutional: Alert and oriented. Well appearing and in no distress. ENT      Head: Normocephalic and atraumatic.      Mouth/Throat: Mucous membranes are moist.  Patient has a fractured/decayed left lower molar.  No signs of abscess surrounding.  Moderate tenderness to palpation. Cardiovascular: Normal rate, regular rhythm.  Respiratory: Normal respiratory effort without tachypnea nor retractions. Breath sounds are clear Gastrointestinal: Soft and nontender. No distention.  Musculoskeletal: Nontender with normal range of motion in all extremities.  Neurologic:  Normal speech and language. No gross focal neurologic deficits  Skin:  Skin is warm, dry and intact.  Psychiatric: Mood and affect are normal. Speech and behavior are normal.   ____________________________________________  INITIAL IMPRESSION / ASSESSMENT AND PLAN / ED COURSE  Pertinent labs & imaging results that were available during my care of the patient were reviewed by me and considered in my medical decision making (see chart for details).   Patient presents emergency department for left lower dental pain.  On exam patient has dental decay/fractured tooth to the left lower molar.  Tenderness to palpation over this area but no abscess.  We will place the patient on antibiotics.  Patient states he is followed up with [facility] for dentistry in the past.  Plans to call today.  I will provide a list of dental clinics in the area for the patient.  Patient agreeable to plan of care.  Jesse Bullock was evaluated in Emergency Department on 08/12/2018 for the symptoms described in the history of present illness. He was evaluated in the context of the global COVID-19 pandemic, which necessitated consideration that the patient might be at risk for infection with the SARS-CoV-2 virus that causes COVID-19. Institutional protocols and algorithms that pertain to the evaluation of  patients at risk for COVID-19 are in a state of rapid change based on information released by regulatory bodies including the CDC and federal and state organizations. These policies and algorithms were followed during the patient's care in the ED.  ____________________________________________   FINAL CLINICAL IMPRESSION(S) / ED DIAGNOSES  Dental pain   Harvest Dark, MD 08/12/18 0730

## 2018-08-12 NOTE — ED Notes (Addendum)
Pt with c/o left lower back molar pain. Pt does not have dentist. Back left lower molar with black around base.

## 2018-08-15 ENCOUNTER — Encounter: Payer: Self-pay | Admitting: Emergency Medicine

## 2018-08-15 ENCOUNTER — Other Ambulatory Visit: Payer: Self-pay

## 2018-08-15 ENCOUNTER — Emergency Department
Admission: EM | Admit: 2018-08-15 | Discharge: 2018-08-15 | Disposition: A | Discharge status: Home / Self Care | Payer: Self-pay | Attending: Emergency Medicine | Admitting: Emergency Medicine

## 2018-08-15 DIAGNOSIS — F17200 Nicotine dependence, unspecified, uncomplicated: Secondary | ICD-10-CM | POA: Insufficient documentation

## 2018-08-15 DIAGNOSIS — K0889 Other specified disorders of teeth and supporting structures: Secondary | ICD-10-CM | POA: Insufficient documentation

## 2018-08-15 MED ORDER — LIDOCAINE VISCOUS HCL 2 % MT SOLN: 10.0000 mL | OROMUCOSAL | 0 refills | Status: DC | PRN

## 2018-08-15 MED ORDER — LIDOCAINE VISCOUS HCL 2 % MT SOLN: 10.0000 mL | OROMUCOSAL | 0 refills | Status: AC | PRN

## 2018-08-15 MED ORDER — CLINDAMYCIN HCL 300 MG PO CAPS: 300.0000 mg | ORAL_CAPSULE | Freq: Three times a day (TID) | ORAL | 0 refills | Status: AC

## 2018-08-15 MED ORDER — LIDOCAINE-EPINEPHRINE 2 %-1:100000 IJ SOLN
1.7000 mL | Freq: Once | INTRAMUSCULAR | Status: AC
  Administered 2018-08-15: 1.7 mL
  Filled 2018-08-15: qty 1.7

## 2018-08-15 NOTE — ED Triage Notes (Signed)
Presents with dental pain  States he was seen this week and placed on pcn  But states pain is spreading to the other side of his mouth

## 2018-08-15 NOTE — ED Provider Notes (Signed)
[facility] Emergency Department Provider Note  ____________________________________________  Time seen: Approximately 4:29 PM  I have reviewed the triage vital signs and the nursing notes.   HISTORY  Chief Complaint Dental Pain    HPI Jesse Bullock is a 28 y.o. male that presents to the emergency department for evaluation of right sided dental pain for 2 days. Patient came to the emergency department for evaluation of left sided dental pain 3 days ago. He was placed on penicillin. Left dental pain completely resolved. Right dental pain has been getting worse.  He has felt hot but has not checked his temperature.  Patient states that he is tried to call several dentist but cannot afford the co-pays.  No fevers.   Past Medical History:  Diagnosis Date  . Allergy   . Bacterial heart infection   . High cholesterol   . Substance abuse ([place])    prior, but has now detoxed    There are no active problems to display for this patient.   History reviewed. No pertinent surgical history.  Prior to Admission medications   Medication Sig Start Date End Date Taking? Authorizing Provider  buprenorphine-naloxone (SUBOXONE) 8-2 mg SUBL SL tablet Place 1 tablet under the tongue 3 (three) times daily. 12/06/17   [provider]  chlorhexidine (PERIDEX) 0.12 % solution Use as directed 15 mLs in the mouth or throat 2 (two) times daily. 12/26/17   Darel Hong, MD  clindamycin (CLEOCIN) 300 MG capsule Take 1 capsule (300 mg total) by mouth 3 (three) times daily for 10 days. 08/15/18 08/25/18  Laban Emperor, PA-C  diltiazem (DILTIAZEM CD) 240 MG 24 hr capsule Take 1 capsule (240 mg total) by mouth daily. 04/06/16   Zara Council A, PA-C  gabapentin (NEURONTIN) 300 MG capsule Take 300 mg by mouth 3 (three) times daily. 12/06/17   [provider]  lidocaine (XYLOCAINE) 2 % solution Use as directed 10 mLs in the mouth or throat as needed. 08/15/18    Laban Emperor, PA-C  magic mouthwash w/lidocaine SOLN Take 5 mLs by mouth 4 (four) times daily. 12/22/17   Cuthriell, Charline Bills, PA-C  ondansetron (ZOFRAN) 4 MG tablet Take 1 tablet (4 mg total) by mouth every 8 (eight) hours as needed for nausea or vomiting. 04/15/18   Schuyler Amor, MD  ondansetron (ZOFRAN-ODT) 4 MG disintegrating tablet Take 4 mg by mouth every 4 (four) hours as needed for nausea. 12/06/17   [provider]  penicillin v potassium (VEETID) 500 MG tablet Take 1 tablet (500 mg total) by mouth 4 (four) times daily. 08/12/18   Harvest Dark, MD  traMADol (ULTRAM) 50 MG tablet Take 1 tablet (50 mg total) by mouth every 6 (six) hours as needed. 12/26/17 12/26/18  Darel Hong, MD    Allergies Ibuprofen, Aspirin, and Haldol [haloperidol lactate]  Family History  Problem Relation Age of Onset  . Heart disease Father   . Heart disease Paternal Grandmother     Social History Social History   Tobacco Use  . Smoking status: Current Every Day Smoker    Packs/day: 0.50  . Smokeless tobacco: Never Used  Substance Use Topics  . Alcohol use: No    Comment: 30+ days  . Drug use: No    Comment: detoxed- in treatment program     Review of Systems  Constitutional: No fever/chills Respiratory: No SOB. Gastrointestinal: No nausea, no vomiting.  Musculoskeletal: Negative for musculoskeletal pain. Skin: Negative for rash, abrasions, lacerations, ecchymosis.  Neurological: Negative for headaches   ____________________________________________   PHYSICAL EXAM:  VITAL SIGNS: ED Triage Vitals [08/15/18 1538]  Enc Vitals Group     BP 121/65     Pulse Rate (!) 57     Resp 16     Temp 98 F (36.7 C)     Temp Source Oral     SpO2 97 %     Weight      Height      Head Circumference      Peak Flow      Pain Score      Pain Loc      Pain Edu?      Excl. in [place]?      Constitutional: Alert and oriented. Well appearing and in no acute distress. Eyes:  Conjunctivae are normal. PERRL. EOMI. Head: Atraumatic. ENT:      Ears:      Nose: No congestion/rhinnorhea.      Mouth/Throat: Mucous membranes are moist. Poor dentition. Right canine decayed to gumline with surrounding tenderness to palpation. No visible swelling.  Neck: No stridor.   Cardiovascular: Normal rate, regular rhythm.  Good peripheral circulation. Respiratory: Normal respiratory effort without tachypnea or retractions. Lungs CTAB. Good air entry to the bases with no decreased or absent breath sounds. Musculoskeletal: Full range of motion to all extremities. No gross deformities appreciated. Neurologic:  Normal speech and language. No gross focal neurologic deficits are appreciated.  Skin:  Skin is warm, dry and intact. No rash noted. Psychiatric: Mood and affect are normal. Speech and behavior are normal. Patient exhibits appropriate insight and judgement.   ____________________________________________   LABS (all labs ordered are listed, but only abnormal results are displayed)  Labs Reviewed - No data to display ____________________________________________  EKG   ____________________________________________  RADIOLOGY   No results found.  ____________________________________________    PROCEDURES  Procedure(s) performed:    .Nerve Block  Date/Time: 08/15/2018 5:37 PM Performed by: Enid DerryWagner, Simone Tuckey, PA-C Authorized by: Enid DerryWagner, Jacobo Moncrief, PA-C   Consent:    Consent obtained:  Verbal   Consent given by:  Patient   Risks discussed:  Nerve damage, swelling, unsuccessful block, pain, infection, bleeding and allergic reaction   Alternatives discussed:  No treatment, delayed treatment, alternative treatment and referral Indications:    Indications:  Pain relief Location:    Body area:  Head   Laterality:  Right Procedure details (see MAR for exact dosages):    Block needle gauge:  25 G   Anesthetic injected:  Lidocaine 2% WITH epi   Steroid injected:   None   Additive injected:  None   Injection procedure:  Anatomic landmarks palpated, introduced needle, negative aspiration for blood, incremental injection and anatomic landmarks identified   Paresthesia:  Prolonged Post-procedure details:    Dressing:  None   Outcome:  Anesthesia achieved   Patient tolerance of procedure:  Tolerated well, no immediate complications  Nerve: superior alveolar nerve    Medications  lidocaine-EPINEPHrine (XYLOCAINE W/EPI) 2 %-1:100000 (with pres) injection 1.7 mL (1.7 mLs Infiltration Given by Other 08/15/18 1700)     ____________________________________________   INITIAL IMPRESSION / ASSESSMENT AND PLAN / ED COURSE  Pertinent labs & imaging results that were available during my care of the patient were reviewed by me and considered in my medical decision making (see chart for details).  Review of the [place] CSRS was performed in accordance of the [facility] prior to dispensing any controlled drugs.     Patient presented the  emergency department for evaluation of right dental pain.  Vital signs and exam are reassuring.  Patient was recently started on penicillin.  We will add clindamycin.  Dental block was completed by PA student Misha with complete relief of dental pain.  Patient will be discharged home with prescriptions for clindamycin.  Patient is to follow up with dentist as directed. Dental referral was given. Patient is given ED precautions to return to the ED for any worsening or new symptoms.  Jesse Bullock was evaluated in Emergency Department on 08/15/2018 for the symptoms described in the history of present illness. He was evaluated in the context of the global COVID-19 pandemic, which necessitated consideration that the patient might be at risk for infection with the SARS-CoV-2 virus that causes COVID-19. Institutional protocols and algorithms that pertain to the evaluation of patients at risk for COVID-19 are in a state of rapid change based on  information released by regulatory bodies including the CDC and federal and state organizations. These policies and algorithms were followed during the patient's care in the ED.   ____________________________________________  FINAL CLINICAL IMPRESSION(S) / ED DIAGNOSES  Final diagnoses:  Pain, dental      NEW MEDICATIONS STARTED DURING THIS VISIT:  ED Discharge Orders         Ordered    clindamycin (CLEOCIN) 300 MG capsule  3 times daily     08/15/18 1731    lidocaine (XYLOCAINE) 2 % solution  As needed,   Status:  Discontinued     08/15/18 1740    lidocaine (XYLOCAINE) 2 % solution  As needed     08/15/18 1741              This chart was dictated using voice recognition software/Dragon. Despite best efforts to proofread, errors can occur which can change the meaning. Any change was purely unintentional.    Enid DerryWagner, Jahmari Esbenshade, PA-C 08/15/18 1743    Minna AntisPaduchowski, Kevin, MD 08/15/18 2252

## 2018-08-15 NOTE — Discharge Instructions (Addendum)
OPTIONS FOR DENTAL FOLLOW UP CARE ° °[place] Department of Health and Human Services - Local Safety Net Dental Clinics °http://www.ncdhhs.gov/dph/oralhealth/services/safetynetclinics.htm °  °[facility] (336-562-3123) ° °[facility] [place] ([phone]) ° °[facility] ([phone] ext [phone]) ° °[facility] ([phone]) ° °[facility] ([phone]) °This clinic caters to the indigent population and is on a lottery system. °Location: °[facility], [address], [place] °Clinic Hours: °Wednesdays from 6pm - 9pm, patients seen by a lottery system. °For dates, call or go to [email or web address]/[facility]/[email or web address] °Services: °Cleanings, fillings and simple extractions. °Payment Options: °DENTAL WORK IS FREE OF CHARGE. Bring proof of income or support. °Best way to get seen: °Arrive at 5:15 pm - this is a lottery, NOT first come/first serve, so arriving earlier will not increase your chances of being seen. °  °  °[facility] °[phone] °Select option 1 for emergencies °  °Location: °[facility], [address], [place] °Clinic Hours: °No walk-ins accepted - call the day before to schedule an appointment. °Check in times are 9:30 am and 1:30 pm. °Services: °Simple extractions, temporary fillings, pulpectomy/pulp debridement, uncomplicated abscess drainage. °Payment Options: °PAYMENT IS DUE AT THE TIME OF SERVICE.  Fee is usually $100-200, additional surgical procedures (e.g. abscess drainage) may be extra. °Cash, checks, Visa/MasterCard accepted.  Can file Medicaid if patient is covered for dental - patient should call case worker to check. °No discount for [facility] patients. °Best way to get seen: °MUST call the day before and get onto the schedule. Can usually be seen the next 1-2 days. No walk-ins accepted. °  °  °[facility] °[phone] °   °Location: °[facility], [address], [place] °Clinic Hours: °M, W, Th, F 8am or 1:30pm, Tues 9a or 1:30 - first come/first served. °Services: °Simple extractions, temporary fillings, uncomplicated abscess drainage.  You do not need to be an [place] resident. °Payment Options: °PAYMENT IS DUE AT THE TIME OF SERVICE. °Dental insurance, otherwise sliding scale - bring proof of income or support. °Depending on income and treatment needed, cost is usually $50-200. °Best way to get seen: °Arrive early as it is first come/first served. °  °  °[facility] °[phone] °  °Location: °[address] °Clinic Hours: °Mon-Thu 8a-5p °Services: °Most basic dental services including extractions and fillings. °Payment Options: °PAYMENT IS DUE AT THE TIME OF SERVICE. °Sliding scale, up to 50% off - bring proof if income or support. °Medicaid with dental option accepted. °Best way to get seen: °Call to schedule an appointment, can usually be seen within 2 weeks OR they will try to see walk-ins - show up at 8a or 2p (you may have to wait). °  °  °[facility] °[phone] °[place] RESIDENTS ONLY °  °Location: °[facility], [address], [place], [place] [address] °Clinic Hours: By appointment only. °Monday - Thursday 8am-5pm, Friday 8am-12pm °Services: Cleanings, fillings, extractions. °Payment Options: °PAYMENT IS DUE AT THE TIME OF SERVICE. °Cash, Visa or MasterCard. Sliding scale - $30 minimum per service. °Best way to get seen: °Come in to office, complete packet and make an appointment - need proof of income °or support monies for each household member and proof of [place] residence. °Usually takes about a month to get in. °  °  °[facility] °[phone] °  °Location: °[address]   [place] °Clinic Hours: Walk-in Urgent Care Dental Services are offered Monday-Friday  mornings only. °The numbers of emergencies accepted daily is limited to the number of °providers available. °Maximum 15 - Mondays, Wednesdays & Thursdays °Maximum 10 - Tuesdays & Fridays °Services: °You do not need to be a [place] resident to be seen for a dental emergency. °Emergencies are defined as pain, swelling, abnormal bleeding, or dental trauma. Walkins will receive x-rays if needed. °NOTE: Dental cleaning is not an emergency. °Payment Options: °PAYMENT IS DUE AT THE TIME OF SERVICE. °Minimum co-pay is $40.00 for uninsured patients. °Minimum co-pay is $3.00 for Medicaid with dental coverage. °Dental Insurance is accepted and must be presented at time of visit. °Medicare does not cover dental. °Forms of payment: Cash, credit card, checks. °Best way to get seen: °If not previously registered with the clinic, walk-in dental registration begins at 7:15 am and is on a first come/first serve basis. °If previously registered with the clinic, call to make an appointment. °  °  °[facility] °[phone] °[place] RESIDENTS ONLY °  °Location: °[address], [place], [place] °Clinic Hours: °Mon-Thu 10a-2p °Services: Extractions only! °Payment Options: °FREE (donations accepted) - bring proof of income or support °Best way to get seen: °Call and schedule an appointment OR come at 8am on the 1st Monday of every month (except for holidays) when it is first come/first served. °  °  °[facility] °[phone] °  °Location: °[address], [place] °Clinic Hours: °Friday mornings °Services, Payment Options, Best way to get seen: °Call for info °

## 2018-09-16 ENCOUNTER — Emergency Department
Admission: EM | Admit: 2018-09-16 | Discharge: 2018-09-16 | Disposition: A | Discharge status: Home / Self Care | Payer: Self-pay | Attending: Emergency Medicine | Admitting: Emergency Medicine

## 2018-09-16 ENCOUNTER — Other Ambulatory Visit: Payer: Self-pay

## 2018-09-16 ENCOUNTER — Encounter: Payer: Self-pay | Admitting: Emergency Medicine

## 2018-09-16 DIAGNOSIS — K0381 Cracked tooth: Secondary | ICD-10-CM | POA: Insufficient documentation

## 2018-09-16 DIAGNOSIS — K0889 Other specified disorders of teeth and supporting structures: Secondary | ICD-10-CM

## 2018-09-16 DIAGNOSIS — S025XXA Fracture of tooth (traumatic), initial encounter for closed fracture: Secondary | ICD-10-CM

## 2018-09-16 DIAGNOSIS — K047 Periapical abscess without sinus: Secondary | ICD-10-CM | POA: Insufficient documentation

## 2018-09-16 DIAGNOSIS — F17210 Nicotine dependence, cigarettes, uncomplicated: Secondary | ICD-10-CM | POA: Insufficient documentation

## 2018-09-16 DIAGNOSIS — Z79899 Other long term (current) drug therapy: Secondary | ICD-10-CM | POA: Insufficient documentation

## 2018-09-16 DIAGNOSIS — K029 Dental caries, unspecified: Secondary | ICD-10-CM | POA: Insufficient documentation

## 2018-09-16 MED ORDER — LIDOCAINE-EPINEPHRINE 2 %-1:100000 IJ SOLN
1.7000 mL | Freq: Once | INTRAMUSCULAR | Status: AC
  Administered 2018-09-16: 1.7 mL via INTRADERMAL
  Filled 2018-09-16: qty 1.7

## 2018-09-16 MED ORDER — AMOXICILLIN-POT CLAVULANATE 875-125 MG PO TABS
1.0000 | ORAL_TABLET | Freq: Once | ORAL | Status: AC
  Administered 2018-09-16: 1 via ORAL
  Filled 2018-09-16: qty 1

## 2018-09-16 MED ORDER — AMOXICILLIN-POT CLAVULANATE 875-125 MG PO TABS: 1.0000 | ORAL_TABLET | Freq: Two times a day (BID) | ORAL | 0 refills | Status: AC

## 2018-09-16 MED ORDER — AMOXICILLIN-POT CLAVULANATE 875-125 MG PO TABS: 1.0000 | ORAL_TABLET | Freq: Two times a day (BID) | ORAL | 0 refills | Status: DC

## 2018-09-16 NOTE — ED Notes (Signed)
Pt now states he feels he needs the 2nd injection.

## 2018-09-16 NOTE — ED Provider Notes (Signed)
[facility] Provider Note   CSN: [id number] Arrival date & time: 09/16/18  6295     History   Chief Complaint Chief Complaint  Patient presents with  . Dental Pain    HPI Jesse Bullock is a 28 y.o. male presents to the emergency department evaluation dental pain to right upper canine, number 6 x 1 week.  He has had history of similar flareups of the same tooth.  He states he had a root canal performed a while back, has had intermittent episodes of pain and mild swelling to the area.  Is responded well to antibiotics in the past.  Unable to for dental clinic visit.  Denies any fevers, difficulty swallowing.  Is requesting dental block today.  Currently on Suboxone.     HPI  Past Medical History:  Diagnosis Date  . Allergy   . Bacterial heart infection   . High cholesterol   . Substance abuse ([place])    prior, but has now detoxed    There are no active problems to display for this patient.   Past Surgical History:  Procedure Laterality Date  . FRACTURE SURGERY     Knee cap, femur  . SPLENECTOMY          Home Medications    Prior to Admission medications   Medication Sig Start Date End Date Taking? Authorizing Provider  amoxicillin-clavulanate (AUGMENTIN) 875-125 MG tablet Take 1 tablet by mouth every 12 (twelve) hours for 7 days. 09/16/18 09/23/18  Duanne Guess, PA-C  buprenorphine-naloxone (SUBOXONE) 8-2 mg SUBL SL tablet Place 1 tablet under the tongue 3 (three) times daily. 12/06/17   [provider]  chlorhexidine (PERIDEX) 0.12 % solution Use as directed 15 mLs in the mouth or throat 2 (two) times daily. 12/26/17   Darel Hong, MD  diltiazem (DILTIAZEM CD) 240 MG 24 hr capsule Take 1 capsule (240 mg total) by mouth daily. 04/06/16   Zara Council A, PA-C  gabapentin (NEURONTIN) 300 MG capsule Take 300 mg by mouth 3 (three) times daily. 12/06/17   [provider]  lidocaine (XYLOCAINE) 2 %  solution Use as directed 10 mLs in the mouth or throat as needed. 08/15/18   Laban Emperor, PA-C  magic mouthwash w/lidocaine SOLN Take 5 mLs by mouth 4 (four) times daily. 12/22/17   Cuthriell, Charline Bills, PA-C  ondansetron (ZOFRAN) 4 MG tablet Take 1 tablet (4 mg total) by mouth every 8 (eight) hours as needed for nausea or vomiting. 04/15/18   Schuyler Amor, MD  ondansetron (ZOFRAN-ODT) 4 MG disintegrating tablet Take 4 mg by mouth every 4 (four) hours as needed for nausea. 12/06/17   [provider]  penicillin v potassium (VEETID) 500 MG tablet Take 1 tablet (500 mg total) by mouth 4 (four) times daily. 08/12/18   Harvest Dark, MD  traMADol (ULTRAM) 50 MG tablet Take 1 tablet (50 mg total) by mouth every 6 (six) hours as needed. 12/26/17 12/26/18  Darel Hong, MD    Family History Family History  Problem Relation Age of Onset  . Heart disease Father   . Heart disease Paternal Grandmother     Social History Social History   Tobacco Use  . Smoking status: Current Every Day Smoker    Packs/day: 0.50  . Smokeless tobacco: Never Used  Substance Use Topics  . Alcohol use: No    Comment: 30+ days  . Drug use: Yes    Types: Marijuana  Comment: detoxed- in treatment program     Allergies   Ibuprofen, Aspirin, and Haldol [haloperidol lactate]   Review of Systems Review of Systems  Constitutional: Negative.  Negative for chills and fever.  HENT: Positive for dental problem. Negative for drooling, facial swelling, mouth sores, trouble swallowing and voice change.   Respiratory: Negative for shortness of breath.   Cardiovascular: Negative for chest pain.  Gastrointestinal: Negative for nausea and vomiting.  Musculoskeletal: Negative for arthralgias, neck pain and neck stiffness.  Skin: Negative.   Psychiatric/Behavioral: Negative for confusion.  All other systems reviewed and are negative.    Physical Exam Updated Vital Signs BP 122/90 (BP Location: Left  Arm)   Pulse 78   Temp 98.8 F (37.1 C) (Oral)   Resp 18   Ht 5\' 6"  (1.676 m)   Wt 52.2 kg   SpO2 96%   BMI 18.56 kg/m   Physical Exam Constitutional:      General: He is not in acute distress.    Appearance: He is well-developed.  HENT:     Head: Normocephalic and atraumatic.     Jaw: No trismus.     Right Ear: External ear normal.     Left Ear: External ear normal.     Nose: Nose normal.     Mouth/Throat:     Mouth: No oral lesions.     Dentition: Normal dentition.     Pharynx: Uvula midline. No uvula swelling.   Neck:     Musculoskeletal: Normal range of motion and neck supple.  Cardiovascular:     Rate and Rhythm: Normal rate.     Heart sounds: No murmur. No friction rub. No gallop.   Pulmonary:     Effort: Pulmonary effort is normal. No respiratory distress.     Breath sounds: Normal breath sounds.  Skin:    General: Skin is warm and dry.  Neurological:     Mental Status: He is alert and oriented to person, place, and time.  Psychiatric:        Behavior: Behavior normal.        Thought Content: Thought content normal.      ED Treatments / Results  Labs (all labs ordered are listed, but only abnormal results are displayed) Labs Reviewed - No data to display  EKG None  Radiology No results found.  Procedures .Nerve Block  Date/Time: 09/16/2018 8:12 PM Performed by: Evon SlackGaines, Thomas C, PA-C Authorized by: Evon SlackGaines, Thomas C, PA-C   Consent:    Consent obtained:  Verbal (Right #1 and #6 dental block)   Consent given by:  Patient Indications:    Indications:  Pain relief Skin anesthesia (see MAR for exact dosages):    Skin anesthesia method:  None Procedure details (see MAR for exact dosages):    Block needle gauge:  25 G   Anesthetic injected:  Lidocaine 1% WITH epi   Injection procedure:  Anatomic landmarks identified   Paresthesia:  Immediately resolved Post-procedure details:    Dressing:  None   (including critical care time)   Medications Ordered in ED Medications  lidocaine-EPINEPHrine (XYLOCAINE W/EPI) 2 %-1:100000 (with pres) injection 1.7 mL (has no administration in time range)  lidocaine-EPINEPHrine (XYLOCAINE W/EPI) 2 %-1:100000 (with pres) injection 1.7 mL (1.7 mLs Intradermal Given 09/16/18 2006)  amoxicillin-clavulanate (AUGMENTIN) 875-125 MG per tablet 1 tablet (1 tablet Oral Given 09/16/18 2001)     Initial Impression / Assessment and Plan / ED Course  I have reviewed the triage vital signs  and the nursing notes.  Pertinent labs & imaging results that were available during my care of the patient were reviewed by me and considered in my medical decision making (see chart for details).        28 year old male with dental pain to tooth #1 and #6.  Has dental caries.  There is minimal soft tissue swelling, tenderness palpation with no abscess formation.  Vital signs are stable, afebrile.  Tolerating p.o. well.  Patient agreed and consented to dental block to tooth #1 and #6.  He was started on Augmentin.  He will follow-up with dental clinic.  He understands signs symptoms return to the ED for.  Before.  Final Clinical Impressions(s) / ED Diagnoses   Final diagnoses:  Pain, dental  Dental caries  Closed fracture of tooth, initial encounter  Dental infection    ED Discharge Orders         Ordered    amoxicillin-clavulanate (AUGMENTIN) 875-125 MG tablet  Every 12 hours     09/16/18 2004           Ronnette JuniperGaines, Thomas C, PA-C 09/16/18 2014    Dionne BucySiadecki, Sebastian, MD 09/16/18 2213

## 2018-09-16 NOTE — ED Triage Notes (Signed)
Patient to ER for c/o right upper dental pain x1 week. Patient reports frequent history of the same.

## 2018-09-16 NOTE — ED Notes (Signed)
Pt asking to wait a few minutes before doing the second injection as he thinks the first one may have been enough.

## 2018-09-16 NOTE — Discharge Instructions (Signed)
Take antibiotics as prescribed.  Continue with Tylenol as needed for pain.  Follow-up with dental clinic.  Return to the ER for any fevers worsening symptoms or urgent changes in your health.

## 2018-09-22 ENCOUNTER — Emergency Department
Admission: EM | Admit: 2018-09-22 | Discharge: 2018-09-22 | Disposition: A | Discharge status: Home / Self Care | Payer: Self-pay | Attending: Emergency Medicine | Admitting: Emergency Medicine

## 2018-09-22 ENCOUNTER — Other Ambulatory Visit: Payer: Self-pay

## 2018-09-22 ENCOUNTER — Encounter: Payer: Self-pay | Admitting: Emergency Medicine

## 2018-09-22 DIAGNOSIS — Z79899 Other long term (current) drug therapy: Secondary | ICD-10-CM | POA: Insufficient documentation

## 2018-09-22 DIAGNOSIS — K0889 Other specified disorders of teeth and supporting structures: Secondary | ICD-10-CM | POA: Insufficient documentation

## 2018-09-22 DIAGNOSIS — F17210 Nicotine dependence, cigarettes, uncomplicated: Secondary | ICD-10-CM | POA: Insufficient documentation

## 2018-09-22 MED ORDER — CLINDAMYCIN PHOSPHATE 600 MG/4ML IJ SOLN
600.0000 mg | Freq: Once | INTRAMUSCULAR | Status: AC
  Administered 2018-09-22: 10:00:00 600 mg via INTRAMUSCULAR
  Filled 2018-09-22: qty 4

## 2018-09-22 MED ORDER — LIDOCAINE-EPINEPHRINE 2 %-1:100000 IJ SOLN
1.7000 mL | Freq: Once | INTRAMUSCULAR | Status: AC
  Administered 2018-09-22: 1.7 mL
  Filled 2018-09-22: qty 1.7

## 2018-09-22 NOTE — ED Provider Notes (Signed)
[facility] Emergency Department Provider Note  ____________________________________________   First MD Initiated Contact with Patient 09/22/18 [phone]     (approximate)  I have reviewed the triage vital signs and the nursing notes.   HISTORY  Chief Complaint Dental Pain   HPI Jesse Bullock is a 28 y.o. male presents to the ED with complaint of dental pain.  Patient was seen in the ED on 09/16/2018 at which time he was placed on Augmentin after taking amoxicillin.  Patient has multiple visits to the ED with same complaint.  Patient has not seen a dentist.  Generally he smokes 1 pack of cigarettes per day but has been unable to due to his dental pain.  He rates his pain as 10 out of 10.     Past Medical History:  Diagnosis Date  . Allergy   . Bacterial heart infection   . High cholesterol   . Substance abuse ([place])    prior, but has now detoxed    There are no active problems to display for this patient.   Past Surgical History:  Procedure Laterality Date  . FRACTURE SURGERY     Knee cap, femur  . SPLENECTOMY      Prior to Admission medications   Medication Sig Start Date End Date Taking? Authorizing Provider  amoxicillin-clavulanate (AUGMENTIN) 875-125 MG tablet Take 1 tablet by mouth every 12 (twelve) hours for 7 days. 09/16/18 09/23/18  Duanne Guess, PA-C  buprenorphine-naloxone (SUBOXONE) 8-2 mg SUBL SL tablet Place 1 tablet under the tongue 3 (three) times daily. 12/06/17   [provider]  chlorhexidine (PERIDEX) 0.12 % solution Use as directed 15 mLs in the mouth or throat 2 (two) times daily. 12/26/17   Darel Hong, MD  diltiazem (DILTIAZEM CD) 240 MG 24 hr capsule Take 1 capsule (240 mg total) by mouth daily. 04/06/16   Zara Council A, PA-C  gabapentin (NEURONTIN) 300 MG capsule Take 300 mg by mouth 3 (three) times daily. 12/06/17   [provider]  lidocaine (XYLOCAINE) 2 % solution Use as directed 10 mLs in  the mouth or throat as needed. 08/15/18   Laban Emperor, PA-C  magic mouthwash w/lidocaine SOLN Take 5 mLs by mouth 4 (four) times daily. 12/22/17   Cuthriell, Charline Bills, PA-C  ondansetron (ZOFRAN) 4 MG tablet Take 1 tablet (4 mg total) by mouth every 8 (eight) hours as needed for nausea or vomiting. 04/15/18   Schuyler Amor, MD  ondansetron (ZOFRAN-ODT) 4 MG disintegrating tablet Take 4 mg by mouth every 4 (four) hours as needed for nausea. 12/06/17   [provider]  penicillin v potassium (VEETID) 500 MG tablet Take 1 tablet (500 mg total) by mouth 4 (four) times daily. 08/12/18   Harvest Dark, MD  traMADol (ULTRAM) 50 MG tablet Take 1 tablet (50 mg total) by mouth every 6 (six) hours as needed. 12/26/17 12/26/18  Darel Hong, MD    Allergies Ibuprofen, Aspirin, and Haldol [haloperidol lactate]  Family History  Problem Relation Age of Onset  . Heart disease Father   . Heart disease Paternal Grandmother     Social History Social History   Tobacco Use  . Smoking status: Current Every Day Smoker    Packs/day: 0.50  . Smokeless tobacco: Never Used  Substance Use Topics  . Alcohol use: No    Comment: 30+ days  . Drug use: Yes    Types: Marijuana    Comment: detoxed- in treatment program  Review of Systems Constitutional: No fever/chills Eyes: No visual changes. ENT: Positive dental pain. Cardiovascular: Denies chest pain. Respiratory: Denies shortness of breath. Musculoskeletal: Negative for back pain. Skin: Negative for rash. Psychological: Positive for substance abuse ___________________________________________   PHYSICAL EXAM:  VITAL SIGNS: ED Triage Vitals  Enc Vitals Group     BP 09/22/18 0907 (!) 142/95     Pulse Rate 09/22/18 0907 80     Resp 09/22/18 0907 18     Temp 09/22/18 0907 98.4 F (36.9 C)     Temp Source 09/22/18 0907 Oral     SpO2 09/22/18 0907 99 %     Weight 09/22/18 0903 115 lb 1.3 oz (52.2 kg)     Height --      Head  Circumference --      Peak Flow --      Pain Score 09/22/18 0903 10     Pain Loc --      Pain Edu? --      Excl. in GC? --    Constitutional: Alert and oriented. Well appearing and in no acute distress. Eyes: Conjunctivae are normal.  Head: Atraumatic. Nose: No congestion/rhinnorhea. Mouth/Throat: Mucous membranes are moist.  Oropharynx non-erythematous.  Multiple dental cavities and poor dental hygiene.  There is a number of cavities noted on the right upper premolar and molar area.  The tooth in question is right upper molar with dental carry that has involved the posterior portion of his tooth. Neck: No stridor.   Hematological/Lymphatic/Immunilogical: No cervical lymphadenopathy. Cardiovascular: Normal rate, regular rhythm. Grossly normal heart sounds.  Good peripheral circulation. Respiratory: Normal respiratory effort.  No retractions. Lungs CTAB. Musculoskeletal: Moves upper and lower extremities with any difficulty.  Normal gait was noted. Neurologic:  Normal speech and language. No gross focal neurologic deficits are appreciated. No gait instability. Skin:  Skin is warm, dry and intact. No rash noted. Psychiatric: Mood and affect are normal.   ____________________________________________   LABS (all labs ordered are listed, but only abnormal results are displayed)  Labs Reviewed - No data to display  PROCEDURES  Procedure(s) performed (including Critical Care):  .Nerve Block  Date/Time: 09/22/2018 10:57 AM Performed by: Tommi Rumps, PA-C Authorized by: Tommi Rumps, PA-C    Dental block was performed with a preloaded syringe.   ____________________________________________   INITIAL IMPRESSION / ASSESSMENT AND PLAN / ED COURSE  As part of my medical decision making, I reviewed the following data within the electronic MEDICAL RECORD NUMBER Notes from prior ED visits and [place] Controlled Substance Database  28 year old male presents to the ED with complaint of  dental pain.  He has been seen here multiple times and each time given a list of dental clinics.  He states that he still has not made an appointment.  Today he is adamant that he wants a dental block.  He is aware that this is temporary and that he has had multiple dental blocks in the past.  Patient is currently taking Suboxone and has a history of substance abuse.  A narcotic will not be prescribed.  A dental block was performed and patient also got clindamycin 600 mg IM.  He is continue on his current antibiotics.  He was given list of dental clinics and specifically information about the [facility] walk-in clinic.  ____________________________________________   FINAL CLINICAL IMPRESSION(S) / ED DIAGNOSES  Final diagnoses:  Pain, dental     ED Discharge Orders    None  Note:  This document was prepared using Dragon voice recognition software and may include unintentional dictation errors.    Tommi RumpsSummers,  L, PA-C 09/22/18 1100    Minna AntisPaduchowski, Kevin, MD 09/22/18 1422

## 2018-09-22 NOTE — Discharge Instructions (Addendum)
Follow-up with your primary care provider if any continued problems or concerns or additional medication is needed.  A list of dental clinics is listed on your discharge papers.  This week you will need to call.  The clinic at [facility] also has walk-in hours that are available.  OPTIONS FOR DENTAL FOLLOW UP CARE  [place] [facility] and [place] [email or web address] [phone])  [facility] [phone])  [place] [phone])  [place] [phone])  [facility] [phone]) This clinic caters to the indigent population and is on a lottery system. Location: [facility], [employer], [place], [place] [facility] Hours: Wednesdays from 6pm - 9pm, patients seen by a lottery system. For dates, call or go to [email or web address] Services: Cleanings, fillings and simple extractions. Payment Options: DENTAL WORK IS FREE OF CHARGE. Bring proof of income or support. Best way to get seen: Arrive at 5:15 pm - this is a lottery, NOT first come/first serve, so arriving earlier will not increase your chances of being seen.     [facility] [phone] Select option 1 for emergencies   Location: [facility], [place], [address] [place] Clinic Hours: No walk-ins accepted - call the day before to schedule an appointment. Check in times are 9:30 am and 1:30 pm. Services: Simple extractions, temporary fillings, pulpectomy/pulp debridement, uncomplicated abscess drainage. Payment Options: PAYMENT IS DUE AT THE TIME OF SERVICE.  Fee is usually $100-200, additional surgical procedures (e.g. abscess drainage) may be extra. Cash, checks, Visa/MasterCard accepted.  Can file Medicaid if patient is covered for dental  - patient should call case worker to check. No discount for [facility] patients. Best way to get seen: MUST call the day before and get onto the schedule. Can usually be seen the next 1-2 days. No walk-ins accepted.     [facility] [phone]   Location: [place], [place] [facility] Hours: M, W, Th, F 8am or 1:30pm, Tues 9a or 1:30 - first come/first served. Services: Simple extractions, temporary fillings, uncomplicated abscess drainage.  You do not need to be an [facility] resident. Payment Options: PAYMENT IS DUE AT THE TIME OF SERVICE. Dental insurance, otherwise sliding scale - bring proof of income or support. Depending on income and treatment needed, cost is usually $50-200. Best way to get seen: Arrive early as it is first come/first served.     [facility] [phone]   Location: [place] [facility] Hours: Mon-Thu 8a-5p Services: Most basic dental services including extractions and fillings. Payment Options: PAYMENT IS DUE AT THE TIME OF SERVICE. Sliding scale, up to 50% off - bring proof if income or support. Medicaid with dental option accepted. Best way to get seen: Call to schedule an appointment, can usually be seen within 2 weeks OR they will try to see walk-ins - show up at [place] or 2p (you may have to wait).     [facility] RESIDENTS ONLY   Location: [facility], [place] [address] [place], [place] [address] Clinic Hours: By appointment only. Monday - Thursday 8am-5pm, Friday 8am-12pm Services: Cleanings, fillings, extractions. Payment Options: PAYMENT IS DUE AT THE TIME OF SERVICE. Cash, Visa or MasterCard. Sliding scale - $30 minimum per service. Best way to get seen: Come  in to office, complete packet and make an appointment - need proof of income or support monies for each  household member and proof of [place] residence. Usually takes about a month to get in.     [facility] [phone]   Location: [address] [facility] Hours: Walk-in Urgent Care Dental Services are offered Monday-Friday mornings only. The numbers of emergencies accepted daily is limited to the number of providers available. Maximum 15 - Mondays, Wednesdays & Thursdays Maximum 10 - Tuesdays & Fridays Services: You do not need to be a [place] resident to be seen for a dental emergency. Emergencies are defined as pain, swelling, abnormal bleeding, or dental trauma. Walkins will receive x-rays if needed. NOTE: Dental cleaning is not an emergency. Payment Options: PAYMENT IS DUE AT THE TIME OF SERVICE. Minimum co-pay is $40.00 for uninsured patients. Minimum co-pay is $3.00 for Medicaid with dental coverage. Dental Insurance is accepted and must be presented at time of visit. Medicare does not cover dental. Forms of payment: Cash, credit card, checks. Best way to get seen: If not previously registered with the clinic, walk-in dental registration begins at 7:15 am and is on a first come/first serve basis. If previously registered with the clinic, call to make an appointment.     [facility] [phone] [place] RESIDENTS ONLY   Location: [address], [place], [place] Clinic Hours: Mon-Thu 10a-2p Services: Extractions only! Payment Options: FREE (donations accepted) - bring proof of income or support Best way to get seen: Call and schedule an appointment OR come at 8am on the 1st Monday of every month (except for holidays) when it is first come/first served.     [facility] [phone]   Location: [address], [place] [facility] Hours: Friday mornings Services, Payment Options, Best way to get seen: Call for info

## 2018-09-22 NOTE — ED Triage Notes (Signed)
C/O right upper jaw pain due to dental pain.  States was seen recently and given augmentin, and currently taking meds.  Arrives today with c/o pain.

## 2018-09-22 NOTE — ED Notes (Signed)
Pt reports dental decay on the right upper molar.

## 2019-04-18 IMAGING — CT CT NECK W/ CM
3 of 5 series · 12 of 33 positions shown, 14 images · IV contrast (iopamidol)
Comparison: CT neck December 23, 2017

CLINICAL DATA: Worsening treated dental abscess. History of
substance abuse.

EXAM:
CT NECK WITH CONTRAST
TECHNIQUE: Multidetector CT imaging of the neck was performed using the
standard protocol following the bolus administration of intravenous
contrast.
CONTRAST:  75mL [id number] IOPAMIDOL ([id number]) INJECTION 61%

[Series 2: axial neck · axial · 0.42mm/px · z∈[-262,-120]mm · 4 of 114 slices shown, 5 images]
[im 23/114  soft-tissue]
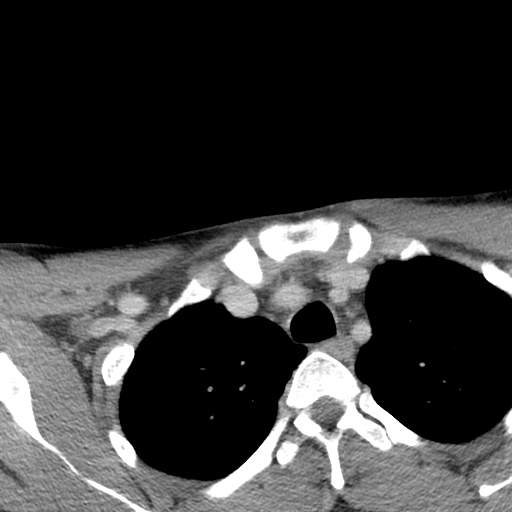
[im 23/114  bone]
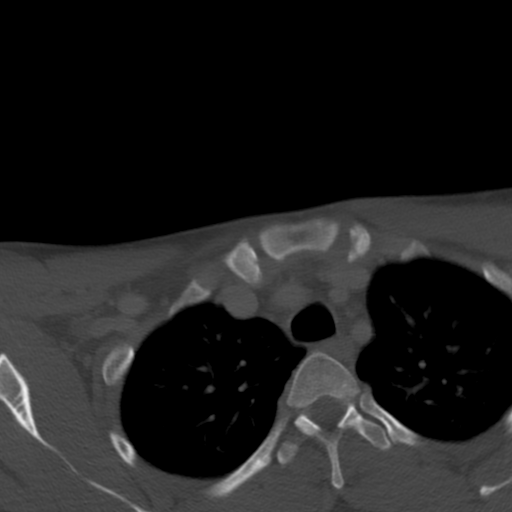
[im 46/114  bone]
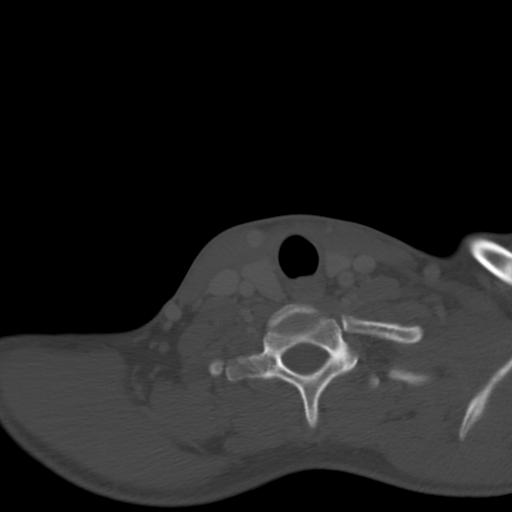
[im 68/114  bone]
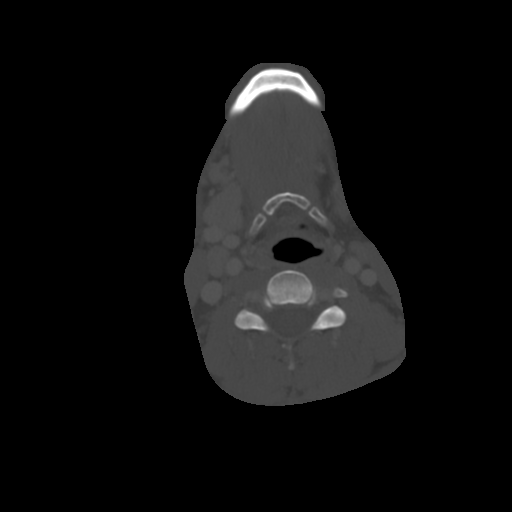
[im 91/114  bone]
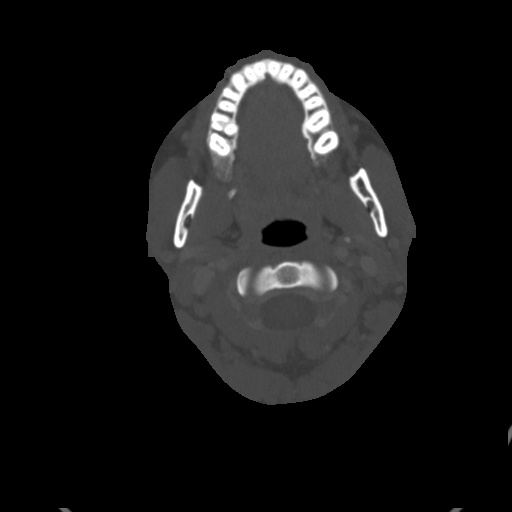

[Series 6: sag neck · sagittal · 0.36mm/px · 5 of 69 slices shown, 6 images]
[im 23/69  bone]
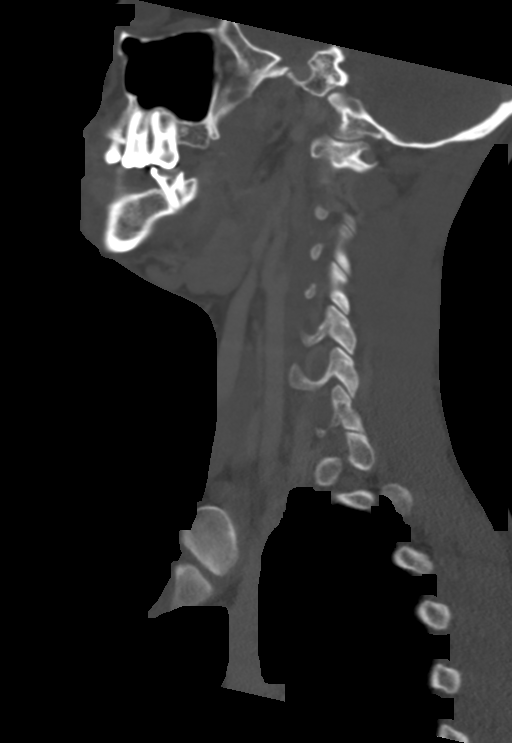
[im 29/69  bone]
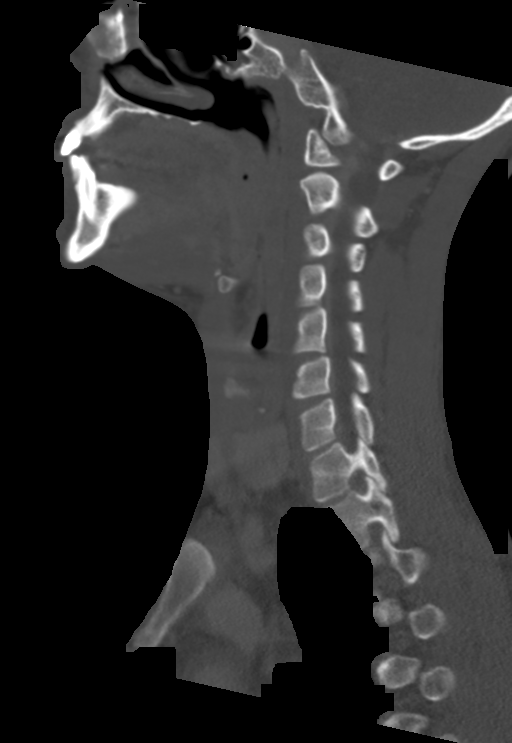
[im 35/69  soft-tissue]
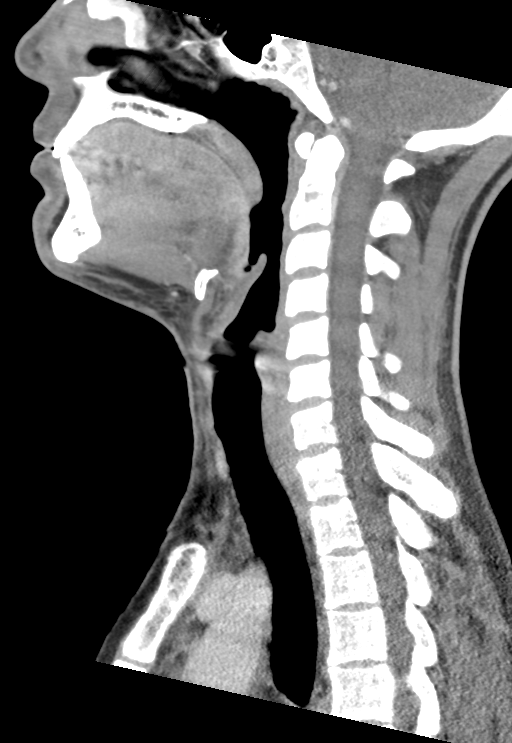
[im 35/69  bone]
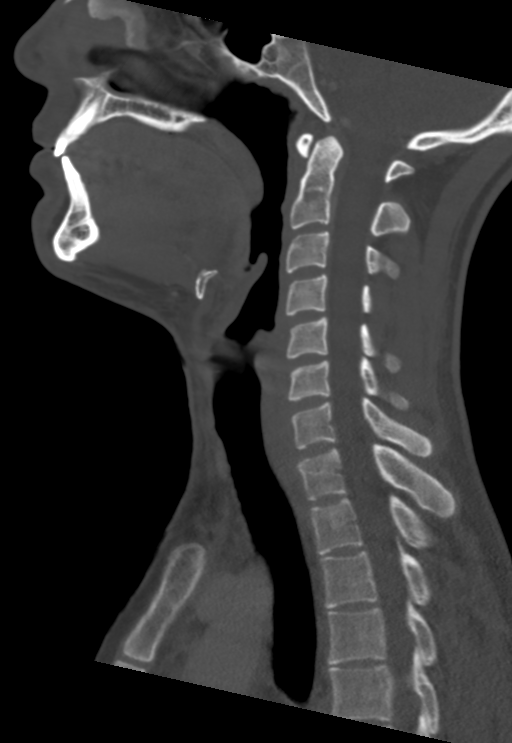
[im 40/69  bone]
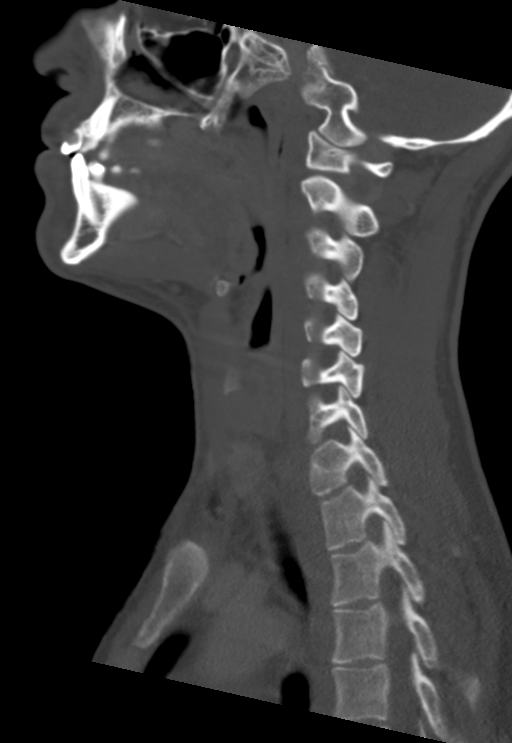
[im 46/69  bone]
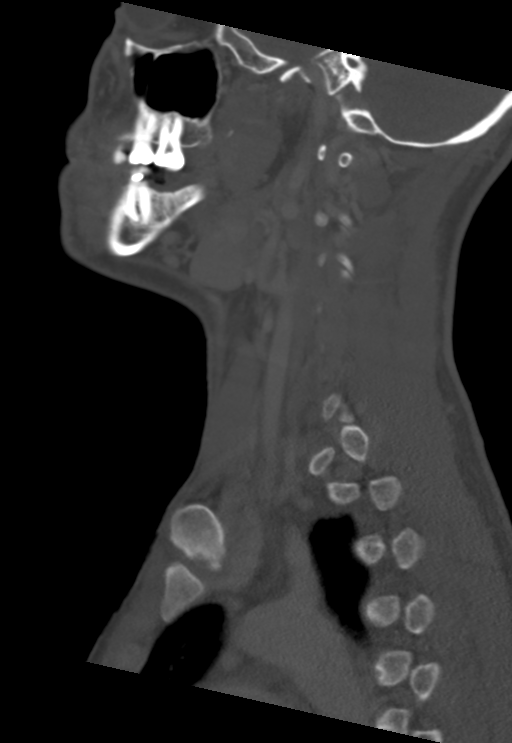

[Series 7: cor neck · coronal · 0.31mm/px · 3 of 79 slices shown]
[im 16/79  bone]
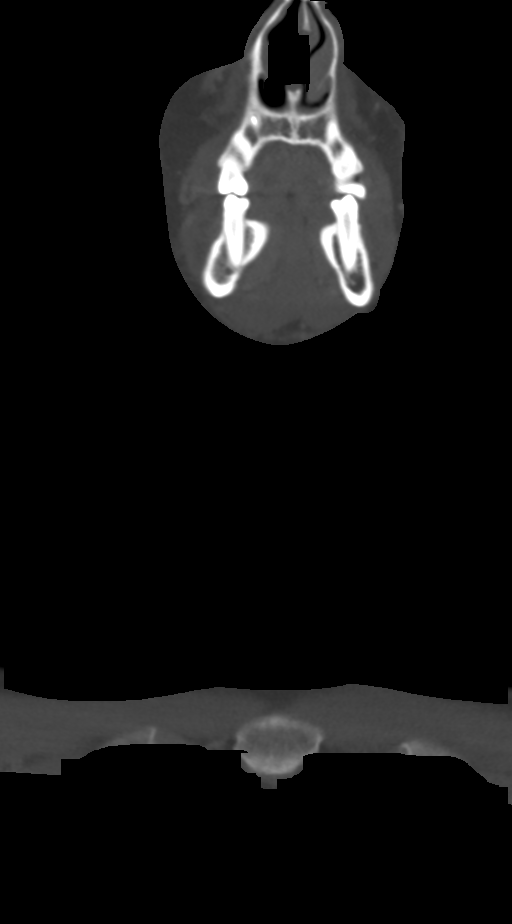
[im 32/79  bone]
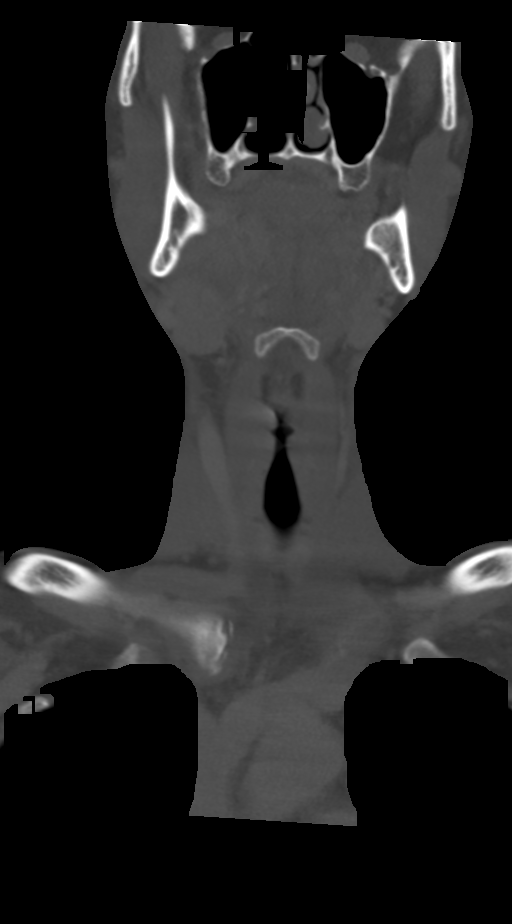
[im 47/79  bone]
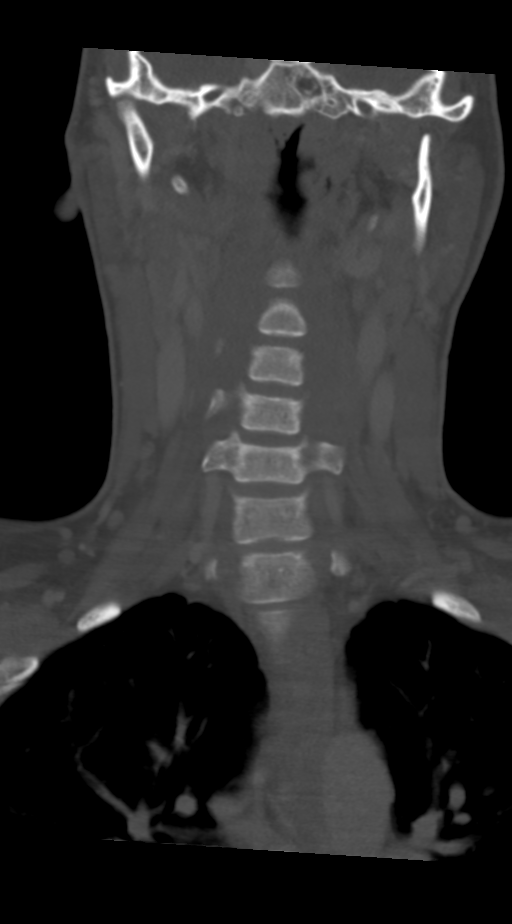

[12 of 33 positions shown; findings below may reference images not displayed]

FINDINGS: PHARYNX AND LARYNX: RIGHT floor mild reactive edema. Normal larynx.
Widely patent airway.

SALIVARY GLANDS: Normal.

THYROID: Normal.

LYMPH NODES: No lymphadenopathy by CT size criteria. Small reactive
scattered lymph nodes.

VASCULAR: Normal.

LIMITED INTRACRANIAL: Normal.

VISUALIZED ORBITS: Partially imaged scleral thickening.

MASTOIDS AND VISUALIZED PARANASAL SINUSES: Well-aerated.

SKELETON: Nonacute. Multiple dental caries. RIGHT mandible molar
periapical abscess.

UPPER CHEST: Lung apices are clear. No superior mediastinal
lymphadenopathy.

OTHER: 6 x 14 mm subperiosteal abscess RIGHT mandible body was 4 x
10 mm. Mild surrounding edema and subcutaneous fat stranding.
IMPRESSION: 1. Enlarging 6 x 14 mm RIGHT mandible subperiosteal odontogenic
abscess. RIGHT neck reactive edema. Patent airway.
2. Similar mild scleral thickening may be infectious or
inflammatory.
